# Patient Record
Sex: Male | Born: 1975 | Race: Black or African American | Hispanic: No | Marital: Married | Smoking: Never smoker
Health system: Southern US, Community
[De-identification: ages and names within clinical notes are randomized; demographics above are authoritative.]

## PROBLEM LIST (undated history)

## (undated) DIAGNOSIS — R079 Chest pain, unspecified: Secondary | ICD-10-CM

## (undated) DIAGNOSIS — G4730 Sleep apnea, unspecified: Secondary | ICD-10-CM

## (undated) DIAGNOSIS — R0602 Shortness of breath: Principal | ICD-10-CM

## (undated) HISTORY — DX: Shortness of breath: R06.02

## (undated) HISTORY — DX: Chest pain, unspecified: R07.9

## (undated) HISTORY — DX: Sleep apnea, unspecified: G47.30

---

## 2009-08-20 ENCOUNTER — Emergency Department (HOSPITAL_COMMUNITY): Admission: EM | Admit: 2009-08-20 | Discharge: 2009-08-20 | Payer: Self-pay | Admitting: Emergency Medicine

## 2012-12-11 ENCOUNTER — Ambulatory Visit: Payer: Self-pay | Admitting: Family Medicine

## 2012-12-11 LAB — DOT URINE DIP: Glucose,UR: NEGATIVE mg/dL (ref 0–75)

## 2018-08-02 NOTE — Progress Notes (Signed)
Cardiology Office Note:    Date:  08/03/2018   ID:  Jack Leitz., DOB [birth year], MRN [id number]  PCP:  Patient, No Pcp Per  Cardiologist:  Jack Herrlich, MD   Referring MD: No ref. provider found  ASSESSMENT:    1. Shortness of breath   2. Chest pain, unspecified type   3. Obstructive sleep apnea syndrome   4. Adult BMI 50.0-59.9 kg/sq m [facility])    PLAN:    In order of problems listed above:  1. He has shortness of breath associated with edema and temp symptoms typical of heart failure I will start him on a loop diuretic check chest x-ray routine labs proBNP level and echocardiogram ordered. 2. He has some symptoms of exertional chest pain EKG is no acute changes check troponin level weight echocardiogram and a require an ischemia evaluation 3. Poorly controlled he needs follow-up with sleep apnea practitioner 4. He has morbid obesity and this can be a cause of heart failure and also pulmonary hypoventilation syndromes.  Next appointment 1 week   Medication Adjustments/Labs and Tests Ordered: Current medicines are reviewed at length with the patient today.  Concerns regarding medicines are outlined above.  Orders Placed This Encounter  Procedures  . DG Chest 2 View  . Comprehensive Metabolic Panel (CMET)  . Pro b natriuretic peptide (BNP)  . Troponin I  . CBC  . EKG 12-Lead  . ECHOCARDIOGRAM COMPLETE   Meds ordered this encounter  Medications  . furosemide (LASIX) 40 MG tablet    Sig: Take 1 tablet (40 mg total) by mouth daily.    Dispense:  30 tablet    Refill:  3     Chief Complaint  Patient presents with  . Shortness of Breath    History of Present Illness:    Jack Lindner. is a 42 y.o. male who is being seen today for the evaluation of shortness of breath and chest pain self referred.  He has a history of obstructive sleep apnea but tells me his device is not working well with leak around the mask.  He has no known history of heart disease or  hypertension.  Recently has become aware of increasing shortness of breath that started when he tried to play with his son outdoors and developed severe shortness of breath that caused him to stop and rest for relief associated with some chest tightness.  Clearly shortness of breath was the predominant symptom he has intermittent episodes of chest tightness but is not always present when he finds himself severely short of breath the symptoms do not occur with minor activities walking from the car walking in his home and also is having episodes of being breathless at night and the other evening had to sit up on side of bed to recover.  There is been no palpitation or syncope associated with no cough or wheezing.  All this has made her very apprehensive and concerned and called our office yesterday.  His EKG in the office today is normal he has peripheral edema and I suspect he is in decompensated heart failure I will start him on a loop diuretic check labs including troponin proBNP I will do an echocardiogram at first opportunity early next week chest x-ray was ordered to be done today if possible.  Strongly encouraged him and his wife to call his pulmonologist as well as the device people to see if he can have his mask corrected  Past Medical History:  Diagnosis  Date  . Chest pain 08/03/2018  . Shortness of breath 08/03/2018    Past Surgical History:  Procedure Laterality Date  . NO PAST SURGERIES      Current Medications: No outpatient medications have been marked as taking for the 08/03/18 encounter (Office Visit) with Baldo Daub, MD.     Allergies:   Patient has no known allergies.   Social History   Socioeconomic History  . Marital status: Single    Spouse name: Not on file  . Number of children: Not on file  . Years of education: Not on file  . Highest education level: Not on file  Occupational History  . Not on file  Social Needs  . Financial resource strain: Not on file  .  Food insecurity:    Worry: Not on file    Inability: Not on file  . Transportation needs:    Medical: Not on file    Non-medical: Not on file  Tobacco Use  . Smoking status: Never Smoker  . Smokeless tobacco: Never Used  Substance and Sexual Activity  . Alcohol use: Yes    Comment: occ  . Drug use: Never  . Sexual activity: Not on file  Lifestyle  . Physical activity:    Days per week: Not on file    Minutes per session: Not on file  . Stress: Not on file  Relationships  . Social connections:    Talks on phone: Not on file    Gets together: Not on file    Attends religious service: Not on file    Active member of club or organization: Not on file    Attends meetings of clubs or organizations: Not on file    Relationship status: Not on file  Other Topics Concern  . Not on file  Social History Narrative  . Not on file     Family History: The patient's family history includes Diabetes in his maternal aunt; Heart disease in his mother; Hyperlipidemia in his father and mother; Hypertension in his mother.  ROS:   Review of Systems  Constitution: Positive for malaise/fatigue.  HENT: Negative.   Eyes: Negative.   Cardiovascular: Positive for chest pain, dyspnea on exertion, leg swelling, orthopnea and paroxysmal nocturnal dyspnea.  Respiratory: Positive for shortness of breath and snoring.   Endocrine: Negative.   Hematologic/Lymphatic: Negative.   Skin: Negative.   Musculoskeletal: Negative.   Gastrointestinal: Negative.   Genitourinary: Negative.   Neurological: Negative.   Psychiatric/Behavioral: Negative.   Allergic/Immunologic: Negative.    Please see the history of present illness.     All other systems reviewed and are negative.  EKGs/Labs/Other Studies Reviewed:    The following studies were reviewed today:   EKG:  EKG is  ordered today.  The ekg ordered today demonstrates sinus rhythm normal  Recent Labs: No results found for requested labs within last  8760 hours.  Recent Lipid Panel No results found for: CHOL, TRIG, HDL, CHOLHDL, VLDL, LDLCALC, LDLDIRECT  Physical Exam:    VS:  BP 114/88 (BP Location: Right Arm, Patient Position: Sitting, Cuff Size: Large)   Pulse 91   Ht 6\' 1"  (1.854 m)   Wt (!) 384 lb 1.9 oz (174.2 kg)   SpO2 96%   BMI 50.68 kg/m     Wt Readings from Last 3 Encounters:  08/03/18 (!) 384 lb 1.9 oz (174.2 kg)     GEN: Anxious morbidly obese well nourished, well developed in no acute distress HEENT: Normal  NECK: No JVD; No carotid bruits LYMPHATICS: No lymphadenopathy CARDIAC: Distant heart sounds RRR, no murmurs, rubs, gallops RESPIRATORY:  Clear to auscultation without rales, wheezing or rhonchi  ABDOMEN: Soft, non-tender, non-distended MUSCULOSKELETAL: 1-2+ pretibial edema; No deformity  SKIN: Warm and dry NEUROLOGIC:  Alert and oriented x 3 PSYCHIATRIC:  Normal affect     Signed, Jack HerrlichBrian Rayce Brahmbhatt, MD  08/03/2018 5:05 PM    [facility]

## 2018-08-03 ENCOUNTER — Encounter: Payer: Self-pay | Admitting: Cardiology

## 2018-08-03 ENCOUNTER — Ambulatory Visit (HOSPITAL_BASED_OUTPATIENT_CLINIC_OR_DEPARTMENT_OTHER)
Admission: RE | Admit: 2018-08-03 | Discharge: 2018-08-03 | Disposition: A | Discharge status: Home / Self Care | Payer: Medicaid Other | Source: Ambulatory Visit | Attending: Cardiology | Admitting: Cardiology

## 2018-08-03 ENCOUNTER — Ambulatory Visit (INDEPENDENT_AMBULATORY_CARE_PROVIDER_SITE_OTHER): Payer: Self-pay | Admitting: Cardiology

## 2018-08-03 VITALS — BP 114/88 | HR 91 | Ht 73.0 in | Wt 384.1 lb

## 2018-08-03 DIAGNOSIS — R0789 Other chest pain: Secondary | ICD-10-CM | POA: Insufficient documentation

## 2018-08-03 DIAGNOSIS — G4733 Obstructive sleep apnea (adult) (pediatric): Secondary | ICD-10-CM

## 2018-08-03 DIAGNOSIS — R0602 Shortness of breath: Secondary | ICD-10-CM | POA: Insufficient documentation

## 2018-08-03 DIAGNOSIS — R079 Chest pain, unspecified: Secondary | ICD-10-CM

## 2018-08-03 DIAGNOSIS — Z6841 Body Mass Index (BMI) 40.0 and over, adult: Secondary | ICD-10-CM

## 2018-08-03 DIAGNOSIS — G4730 Sleep apnea, unspecified: Secondary | ICD-10-CM | POA: Insufficient documentation

## 2018-08-03 HISTORY — DX: Chest pain, unspecified: R07.9

## 2018-08-03 HISTORY — DX: Shortness of breath: R06.02

## 2018-08-03 MED ORDER — FUROSEMIDE 40 MG PO TABS: 40.0000 mg | ORAL_TABLET | Freq: Every day | ORAL | 0 refills | Status: DC

## 2018-08-03 MED ORDER — FUROSEMIDE 40 MG PO TABS: 40.0000 mg | ORAL_TABLET | Freq: Every day | ORAL | 3 refills | Status: DC

## 2018-08-03 NOTE — Patient Instructions (Signed)
Medication Instructions:  Your physician has recommended you make the following change in your medication:   START furosemide (lasix) 40 mg: Take 1 tablet daily. START TODAY!  If you need a refill on your cardiac medications before your next appointment, please call your pharmacy.   Lab work: Your physician recommends that you return for lab work today: STAT CMP, ProBNP, Troponin I, CBC.   If you have labs (blood work) drawn today and your tests are completely normal, you will receive your results only by: Marland Kitchen MyChart Message (if you have MyChart) OR . A paper copy in the mail If you have any lab test that is abnormal or we need to change your treatment, we will call you to review the results.  Testing/Procedures: You had an EKG today.   A chest x-ray takes a picture of the organs and structures inside the chest, including the heart, lungs, and blood vessels. This test can show several things, including, whether the heart is enlarges; whether fluid is building up in the lungs; and whether pacemaker / defibrillator leads are still in place.  Your physician has requested that you have an echocardiogram. Echocardiography is a painless test that uses sound waves to create images of your heart. It provides your doctor with information about the size and shape of your heart and how well your heart's chambers and valves are working. This procedure takes approximately one hour. There are no restrictions for this procedure.  Follow-Up: At [facility], you and your health needs are our priority.  As part of our continuing mission to provide you with exceptional heart care, we have created designated Provider Care Teams.  These Care Teams include your primary Cardiologist (physician) and Advanced Practice Providers (APPs -  Physician Assistants and Nurse Practitioners) who all work together to provide you with the care you need, when you need it. You will need a follow up appointment in 1 weeks.      Furosemide tablets What is this medicine? FUROSEMIDE (fyoor [place] se mide) is a diuretic. It helps you make more urine and to lose salt and excess water from your body. This medicine is used to treat high blood pressure, and edema or swelling from heart, kidney, or liver disease. This medicine may be used for other purposes; ask your health care provider or pharmacist if you have questions. COMMON BRAND NAME(S): Active-Medicated Specimen Kit, Delone, Diuscreen, Lasix, RX Specimen Collection Kit, Specimen Collection Kit, URINX Medicated Specimen Collection What should I tell my health care provider before I take this medicine? They need to know if you have any of these conditions: -abnormal blood electrolytes -diarrhea or vomiting -gout -heart disease -kidney disease, small amounts of urine, or difficulty passing urine -liver disease -thyroid disease -an unusual or allergic reaction to furosemide, sulfa drugs, other medicines, foods, dyes, or preservatives -pregnant or trying to get pregnant -breast-feeding How should I use this medicine? Take this medicine by mouth with a glass of water. Follow the directions on the prescription label. You may take this medicine with or without food. If it upsets your stomach, take it with food or milk. Do not take your medicine more often than directed. Remember that you will need to pass more urine after taking this medicine. Do not take your medicine at a time of day that will cause you problems. Do not take at bedtime. Talk to your pediatrician regarding the use of this medicine in children. While this drug may be prescribed for selected conditions, precautions  do apply. Overdosage: If you think you have taken too much of this medicine contact a poison control center or emergency room at once. NOTE: This medicine is only for you. Do not share this medicine with others. What if I miss a dose? If you miss a dose, take it as soon as you can. If it is  almost time for your next dose, take only that dose. Do not take double or extra doses. What may interact with this medicine? -aspirin and aspirin-like medicines -certain antibiotics -chloral hydrate -cisplatin -cyclosporine -digoxin -diuretics -laxatives -lithium -medicines for blood pressure -medicines that relax muscles for surgery -methotrexate -NSAIDs, medicines for pain and inflammation like ibuprofen, naproxen, or indomethacin -phenytoin -steroid medicines like prednisone or cortisone -sucralfate -thyroid hormones This list may not describe all possible interactions. Give your health care provider a list of all the medicines, herbs, non-prescription drugs, or dietary supplements you use. Also tell them if you smoke, drink alcohol, or use illegal drugs. Some items may interact with your medicine. What should I watch for while using this medicine? Visit your doctor or health care professional for regular checks on your progress. Check your blood pressure regularly. Ask your doctor or health care professional what your blood pressure should be, and when you should contact him or her. If you are a diabetic, check your blood sugar as directed. You may need to be on a special diet while taking this medicine. Check with your doctor. Also, ask how many glasses of fluid you need to drink a day. You must not get dehydrated. You may get drowsy or dizzy. Do not drive, use machinery, or do anything that needs mental alertness until you know how this drug affects you. Do not stand or sit up quickly, especially if you are an older patient. This reduces the risk of dizzy or fainting spells. Alcohol can make you more drowsy and dizzy. Avoid alcoholic drinks. This medicine can make you more sensitive to the sun. Keep out of the sun. If you cannot avoid being in the sun, wear protective clothing and use sunscreen. Do not use sun lamps or tanning beds/booths. What side effects may I notice from receiving  this medicine? Side effects that you should report to your doctor or health care professional as soon as possible: -blood in urine or stools -dry mouth -fever or chills -hearing loss or ringing in the ears -irregular heartbeat -muscle pain or weakness, cramps -skin rash -stomach upset, pain, or nausea -tingling or numbness in the hands or feet -unusually weak or tired -vomiting or diarrhea -yellowing of the eyes or skin Side effects that usually do not require medical attention (report to your doctor or health care professional if they continue or are bothersome): -headache -loss of appetite -unusual bleeding or bruising This list may not describe all possible side effects. Call your doctor for medical advice about side effects. You may report side effects to FDA at 1-800-FDA-1088. Where should I keep my medicine? Keep out of the reach of children. Store at room temperature between 15 and 30 degrees C (59 and 86 degrees F). Protect from light. Throw away any unused medicine after the expiration date. NOTE: This sheet is a summary. It may not cover all possible information. If you have questions about this medicine, talk to your doctor, pharmacist, or health care provider.  2018 Elsevier/Gold Standard (2014-11-19 13:49:50)    Echocardiogram An echocardiogram, or echocardiography, uses sound waves (ultrasound) to produce an image of your heart. The echocardiogram  is simple, painless, obtained within a short period of time, and offers valuable information to your health care provider. The images from an echocardiogram can provide information such as:  Evidence of coronary artery disease (CAD).  Heart size.  Heart muscle function.  Heart valve function.  Aneurysm detection.  Evidence of a past heart attack.  Fluid buildup around the heart.  Heart muscle thickening.  Assess heart valve function.  Tell a health care provider about:  Any allergies you have.  All  medicines you are taking, including vitamins, herbs, eye drops, creams, and over-the-counter medicines.  Any problems you or family members have had with anesthetic medicines.  Any blood disorders you have.  Any surgeries you have had.  Any medical conditions you have.  Whether you are pregnant or may be pregnant. What happens before the procedure? No special preparation is needed. Eat and drink normally. What happens during the procedure?  In order to produce an image of your heart, gel will be applied to your chest and a wand-like tool (transducer) will be moved over your chest. The gel will help transmit the sound waves from the transducer. The sound waves will harmlessly bounce off your heart to allow the heart images to be captured in real-time motion. These images will then be recorded.  You may need an IV to receive a medicine that improves the quality of the pictures. What happens after the procedure? You may return to your normal schedule including diet, activities, and medicines, unless your health care provider tells you otherwise. This information is not intended to replace advice given to you by your health care provider. Make sure you discuss any questions you have with your health care provider. Document Released: 08/26/2000 Document Revised: 04/16/2016 Document Reviewed: 05/06/2013 Elsevier Interactive Patient Education  2017 [employer].

## 2018-08-04 LAB — COMPREHENSIVE METABOLIC PANEL
ALK PHOS: 67 IU/L (ref 39–117)
ALT: 12 IU/L (ref 0–44)
AST: 12 IU/L (ref 0–40)
Albumin/Globulin Ratio: 1.1 — ABNORMAL LOW (ref 1.2–2.2)
Albumin: 4.3 g/dL (ref 3.5–5.5)
BUN/Creatinine Ratio: 12 (ref 9–20)
BUN: 14 mg/dL (ref 6–24)
Bilirubin Total: <0.2 mg/dL (ref 0.0–1.2)
CO2: 21 mmol/L (ref 20–29)
CREATININE: 1.2 mg/dL (ref 0.76–1.27)
Calcium: 9.5 mg/dL (ref 8.7–10.2)
Chloride: 102 mmol/L (ref 96–106)
GFR calc Af Amer: 87 mL/min/{1.73_m2} (ref >59)
GFR calc non Af Amer: 75 mL/min/{1.73_m2} (ref >59)
GLUCOSE: 88 mg/dL (ref 65–99)
Globulin, Total: 4 g/dL (ref 1.5–4.5)
Potassium: 3.9 mmol/L (ref 3.5–5.2)
Sodium: 140 mmol/L (ref 134–144)
Total Protein: 8.3 g/dL (ref 6.0–8.5)

## 2018-08-04 LAB — PRO B NATRIURETIC PEPTIDE: NT-Pro BNP: 77 pg/mL (ref 0–86)

## 2018-08-04 LAB — CBC
HEMOGLOBIN: 13.5 g/dL (ref 13.0–17.7)
Hematocrit: 40.1 % (ref 37.5–51.0)
MCH: 26.5 pg — ABNORMAL LOW (ref 26.6–33.0)
MCHC: 33.7 g/dL (ref 31.5–35.7)
MCV: 79 fL (ref 79–97)
Platelets: 402 10*3/uL (ref 150–450)
RBC: 5.09 x10E6/uL (ref 4.14–5.80)
RDW: 13.6 % (ref 12.3–15.4)
WBC: 8.3 10*3/uL (ref 3.4–10.8)

## 2018-08-04 LAB — TROPONIN I

## 2018-08-06 ENCOUNTER — Telehealth: Payer: Self-pay | Admitting: *Deleted

## 2018-08-06 NOTE — Telephone Encounter (Signed)
[facility] with results for 2 View Chest X-Ray and CT of Chest. Both were normal Hrt size within normal limits, no active cardial disease, both lungs clear. CT of Chest no active pulmonary or cardial disease.

## 2018-08-06 NOTE — Telephone Encounter (Signed)
Report for Dr. Dulce SellarMunley

## 2018-08-06 NOTE — Telephone Encounter (Signed)
Dr. Dulce SellarMunley, here is the CXR and CT report for pt.

## 2018-08-07 ENCOUNTER — Ambulatory Visit (HOSPITAL_COMMUNITY): Payer: Medicaid Other | Attending: Cardiovascular Disease

## 2018-08-07 ENCOUNTER — Encounter: Payer: Self-pay | Admitting: Cardiology

## 2018-08-07 DIAGNOSIS — R079 Chest pain, unspecified: Secondary | ICD-10-CM | POA: Diagnosis not present

## 2018-08-07 DIAGNOSIS — R0602 Shortness of breath: Secondary | ICD-10-CM | POA: Insufficient documentation

## 2018-08-08 ENCOUNTER — Encounter: Payer: Self-pay | Admitting: Cardiology

## 2018-08-08 ENCOUNTER — Ambulatory Visit (INDEPENDENT_AMBULATORY_CARE_PROVIDER_SITE_OTHER): Payer: Self-pay | Admitting: Cardiology

## 2018-08-08 VITALS — BP 112/78 | HR 98 | Ht 73.0 in | Wt 385.2 lb

## 2018-08-08 DIAGNOSIS — G4733 Obstructive sleep apnea (adult) (pediatric): Secondary | ICD-10-CM

## 2018-08-08 DIAGNOSIS — R079 Chest pain, unspecified: Secondary | ICD-10-CM

## 2018-08-08 DIAGNOSIS — R0602 Shortness of breath: Secondary | ICD-10-CM

## 2018-08-08 MED ORDER — POTASSIUM CHLORIDE CRYS ER 20 MEQ PO TBCR: 20.0000 meq | EXTENDED_RELEASE_TABLET | Freq: Every day | ORAL | 3 refills | Status: DC

## 2018-08-08 NOTE — H&P (View-Only) (Signed)
Cardiology Office Note:    Date:  08/08/2018   ID:  Jack Leitz., DOB [birth year], MRN [id number]  PCP:  Patient, No Pcp Per  Cardiologist:  Norman Herrlich, MD    Referring MD: No ref. provider found    ASSESSMENT:    1. Chest pain, unspecified type   2. Shortness of breath   3. Obstructive sleep apnea syndrome    PLAN:    In order of problems listed above:  1. Atypical symptoms however Jack Buckley is a poor candidate for noninvasive evaluation with morbid obesity and severe shortness of breath limiting his ability to perform stress modalities.  I think that this situation is best served by coronary angiography but my expectation is Jack Buckley does not have severe obstructive CAD.  I told the patient his wife is very unusual to have chest pain with sleep apnea. 2. His symptoms are quite disproportionate to echocardiogram arterial blood gas proBNP level and echocardiogram.  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 3. See above poorly treated   Next appointment: 2 to 3 weeks   Medication Adjustments/Labs and Tests Ordered: Current medicines are reviewed at length with the patient today.  Concerns regarding medicines are outlined above.  No orders of the defined types were placed in this encounter.  Meds ordered this encounter  Medications  . potassium chloride SA (KLOR-CON M20) 20 MEQ tablet    Sig: Take 1 tablet (20 mEq total) by mouth daily.    Dispense:  30 tablet    Refill:  3    No chief complaint on file.   History of Present Illness:    Jack Fier. is a 42 y.o. male with a hx of shortness of breath and chest pain last seen 08/03/2018 appeared to be in decompensated heart failure was started as a loop diuretic proBNP level subsequently was low and consistent with heart  failure and echocardiogram does not show findings of systolic or diastolic heart failure or pulmonary artery hypertension. Marland Kitchen Jack Buckley was seen in the emergency room [facility] 08/05/2018 for the same complaints troponin and proBNP were normal CT of the chest showed no evidence of pulmonary embolism or lung disease.  Echo 08/07/18: Study Conclusions - Left ventricle: The cavity size was normal. There was mild concentric hypertrophy. Systolic function was normal. The estimated ejection fraction was in the range of 60% to 65%. Wall motion was normal; there were no regional wall motion abnormalities. Left ventricular diastolic function parameters were normal. - Aortic valve: Transvalvular velocity was within the normal range. There was no stenosis. There was no regurgitation. - Mitral valve: Transvalvular velocity was within the normal range. There was no evidence for stenosis. There was no regurgitation. - Right ventricle: The cavity size was normal. Wall thickness was normal. Systolic function was normal. - Atrial septum: No defect or patent foramen ovale was identified by color flow Doppler. - Tricuspid valve: There was trivial regurgitation. - Pulmonary arteries: Systolic pressure was within the normal range. PA peak pressure: 20 mm Hg (S).  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  I strongly encouraged him to get a new mask and  his wife said she will call his company today to be 100% compliant with CPAP but in view of his ongoing unrelenting symptoms and complaints of chest pain at rest I have asked him to undergo diagnostic coronary angiography benefits risk and options were detailed Jack Buckley has no dye allergy or contraindication to coronary angiography.  I think his body habitus with a BMI of over 50 and his severe shortness of breath limits the utility of noninvasive stress modalities.  Patient and his wife voiced agreement.  In the interim I asked him to stop his loop diuretic and I started on potassium supplement 20 mEq daily with serum potassium of 3.4 at his ED visit.  Jack Buckley is having no cough wheezing no GI discomfort his symptoms are really at rest although they also occur with physical effort where Jack Buckley develops severe shortness of breath the force him to stop and pause.  Arterial blood gas was performed this week and was normal with no CO2 retention and PO2 of 70 on room air Past Medical History:  Diagnosis Date  . Chest pain 08/03/2018  . Shortness of breath 08/03/2018    Past Surgical History:  Procedure Laterality Date  . NO PAST SURGERIES      Current Medications: Current Meds  Medication Sig  . furosemide (LASIX) 40 MG tablet Take 1 tablet (40 mg total) by mouth daily.  . Magnesium 400 MG CAPS Take 1 capsule by mouth daily.     Allergies:   Patient has no known allergies.   Social History   Socioeconomic History  . Marital status: Single    Spouse name: Not on file  . Number of children: Not on file  . Years of education: Not on file  . Highest education level: Not on file  Occupational History  . Not on file  Social Needs  . Financial resource strain: Not on file  . Food insecurity:    Worry: Not on file    Inability: Not on file  . Transportation needs:    Medical: Not on file    Non-medical: Not on file  Tobacco Use  . Smoking status: Never Smoker  . Smokeless tobacco: Never Used    Substance and Sexual Activity  . Alcohol use: Yes    Comment: occ  . Drug use: Never  . Sexual activity: Not on file  Lifestyle  . Physical activity:    Days per week: Not on file    Minutes per session: Not on file  . Stress: Not on file  Relationships  . Social connections:    Talks on phone: Not on file    Gets together: Not on file    Attends religious service: Not on file    Active member of club or organization: Not on file    Attends meetings of clubs or organizations: Not on file    Relationship status: Not on file  Other Topics Concern  . Not on file  Social History Narrative  . Not on file     Family History: The patient's family history includes  Diabetes in his maternal aunt; Heart disease in his mother; Hyperlipidemia in his father and mother; Hypertension in his mother. ROS:   Please see the history of present illness.    All other systems reviewed and are negative.  EKGs/Labs/Other Studies Reviewed:    The following studies were reviewed today:   Recent Labs:  08/05/18 K 3.4 Cr 1.09 Troponin normal on 2 assays  BNP 10! 08/03/2018: ALT 12; BUN 14; Creatinine, Ser 1.20; Hemoglobin 13.5; NT-Pro BNP 77; Platelets 402; Potassium 3.9; Sodium 140  Recent Lipid Panel No results found for: CHOL, TRIG, HDL, CHOLHDL, VLDL, LDLCALC, LDLDIRECT  Physical Exam:    VS:  BP 112/78 (BP Location: Right Arm, Patient Position: Sitting, Cuff Size: Large)   Pulse 98   Ht 6\' 1"  (1.854 m)   Wt (!) 385 lb 3.2 oz (174.7 kg)   SpO2 97%   BMI 50.82 kg/m     Wt Readings from Last 3 Encounters:  08/08/18 (!) 385 lb 3.2 oz (174.7 kg)  08/03/18 (!) 384 lb 1.9 oz (174.2 kg)     GEN: Jack Buckley has marked obesity BMI exceeds 50 is having shortness of breath and chest discomfort tightness at rest.  Well nourished, well developed in no acute distress HEENT: Normal NECK: No JVD; No carotid bruits LYMPHATICS: No lymphadenopathy CARDIAC: Distant heart sounds RRR, no murmurs, rubs,  gallops RESPIRATORY:  Clear to auscultation without rales, wheezing or rhonchi  ABDOMEN: Soft, non-tender, non-distended MUSCULOSKELETAL:  No edema; No deformity  SKIN: Warm and dry NEUROLOGIC:  Alert and oriented x 3 PSYCHIATRIC:  Normal affect    Signed, Norman HerrlichBrian Huyen Perazzo, MD  08/08/2018 4:26 PM    [facility]

## 2018-08-08 NOTE — Patient Instructions (Addendum)
Medication Instructions:  Your physician has recommended you make the following change in your medication:   STOP furosemide (lasix)   START potassium chloride 20 mEq: Take 1 tablet daily   If you need a refill on your cardiac medications before your next appointment, please call your pharmacy.   Lab work:  None  If you have labs (blood work) drawn today and your tests are completely normal, you will receive your results only by: Marland Kitchen. MyChart Message (if you have MyChart) OR . A paper copy in the mail If you have any lab test that is abnormal or we need to change your treatment, we will call you to review the results.  Testing/Procedures:    [facility] [place] [address] [place] [id number] Dept: [phone] Loc: [phone]  Bonney LeitzMichael L Szczygiel Jr.  08/08/2018  You are scheduled for a Cardiac Catheterization on Monday, December 2 with Dr. Cristal Deerhristopher End.  1. Please arrive at the [facility] at [facility]: [address] [place], [place] [address] at 7:00 AM (This time is two hours before your procedure to ensure your preparation). Free valet parking service is available.   Special note: Every effort is made to have your procedure done on time. Please understand that emergencies sometimes delay scheduled procedures.  2. Diet: Do not eat solid foods after midnight.  The patient may have clear liquids until 5am upon the day of the procedure.  3. Labs: None needed.  4. Medication instructions in preparation for your procedure:   Contrast Allergy: No   On the morning of your procedure, take your Aspirin and any morning medicines NOT listed above.  You may use sips of water.  5. Plan for one night stay--bring personal belongings. 6. Bring a current list of your medications and current insurance cards. 7. You MUST have a responsible person to drive you home. 8. Someone MUST  be with you the first 24 hours after you arrive home or your discharge will be delayed. 9. Please wear clothes that are easy to get on and off and wear slip-on shoes.  Thank you for allowing us to care for you!   -- [facility]   Follow-Up: At [facility], you and your health needs are our priority.  As part of our continuing mission to provide you with exceptional heart care, we have created designated Provider Care Teams.  These Care Teams include your primary Cardiologist (physician) and Advanced Practice Providers (APPs -  Physician Assistants and Nurse Practitioners) who all work together to provide you with the care you need, when you need it. You will need a follow up appointment in 3 weeks.

## 2018-08-08 NOTE — Progress Notes (Signed)
Cardiology Office Note:    Date:  08/08/2018   ID:  Jack L Sprague Jr., DOB 12/15/1975, MRN [id number]  PCP:  Patient, No Pcp Per  Cardiologist:   , MD    Referring MD: No ref. provider found    ASSESSMENT:    1. Chest pain, unspecified type   2. Shortness of breath   3. Obstructive sleep apnea syndrome    PLAN:    In order of problems listed above:  1. Atypical symptoms however he is a poor candidate for noninvasive evaluation with morbid obesity and severe shortness of breath limiting his ability to perform stress modalities.  I think that this situation is best served by coronary angiography but my expectation is he does not have severe obstructive CAD.  I told the patient his wife is very unusual to have chest pain with sleep apnea. 2. His symptoms are quite disproportionate to echocardiogram arterial blood gas proBNP level and echocardiogram.  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 3. See above poorly treated   Next appointment: 2 to 3 weeks   Medication Adjustments/Labs and Tests Ordered: Current medicines are reviewed at length with the patient today.  Concerns regarding medicines are outlined above.  No orders of the defined types were placed in this encounter.  Meds ordered this encounter  Medications  . potassium chloride SA (KLOR-CON M20) 20 MEQ tablet    Sig: Take 1 tablet (20 mEq total) by mouth daily.    Dispense:  30 tablet    Refill:  3    No chief complaint on file.   History of Present Illness:    Jack L Canterbury Jr. is a 42 y.o. male with a hx of shortness of breath and chest pain last seen 08/03/2018 appeared to be in decompensated heart failure was started as a loop diuretic proBNP level subsequently was low and consistent with heart  failure and echocardiogram does not show findings of systolic or diastolic heart failure or pulmonary artery hypertension. . He was seen in the emergency room [facility] 08/05/2018 for the same complaints troponin and proBNP were normal CT of the chest showed no evidence of pulmonary embolism or lung disease.  Echo 08/07/18: Study Conclusions - Left ventricle: The cavity size was normal. There was mild concentric hypertrophy. Systolic function was normal. The estimated ejection fraction was in the range of 60% to 65%. Wall motion was normal; there were no regional wall motion abnormalities. Left ventricular diastolic function parameters were normal. - Aortic valve: Transvalvular velocity was within the normal range. There was no stenosis. There was no regurgitation. - Mitral valve: Transvalvular velocity was within the normal range. There was no evidence for stenosis. There was no regurgitation. - Right ventricle: The cavity size was normal. Wall thickness was normal. Systolic function was normal. - Atrial septum: No defect or patent foramen ovale was identified by color flow Doppler. - Tricuspid valve: There was trivial regurgitation. - Pulmonary arteries: Systolic pressure was within the normal range. PA peak pressure: 20 mm Hg (S).  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  I strongly encouraged him to get a new mask and  his wife said she will call his company today to be 100% compliant with CPAP but in view of his ongoing unrelenting symptoms and complaints of chest pain at rest I have asked him to undergo diagnostic coronary angiography benefits risk and options were detailed he has no dye allergy or contraindication to coronary angiography.  I think his body habitus with a BMI of over 50 and his severe shortness of breath limits the utility of noninvasive stress modalities.  Patient and his wife voiced agreement.  In the interim I asked him to stop his loop diuretic and I started on potassium supplement 20 mEq daily with serum potassium of 3.4 at his ED visit.  He is having no cough wheezing no GI discomfort his symptoms are really at rest although they also occur with physical effort where he develops severe shortness of breath the force him to stop and pause.  Arterial blood gas was performed this week and was normal with no CO2 retention and PO2 of 70 on room air Past Medical History:  Diagnosis Date  . Chest pain 08/03/2018  . Shortness of breath 08/03/2018    Past Surgical History:  Procedure Laterality Date  . NO PAST SURGERIES      Current Medications: Current Meds  Medication Sig  . furosemide (LASIX) 40 MG tablet Take 1 tablet (40 mg total) by mouth daily.  . Magnesium 400 MG CAPS Take 1 capsule by mouth daily.     Allergies:   Patient has no known allergies.   Social History   Socioeconomic History  . Marital status: Single    Spouse name: Not on file  . Number of children: Not on file  . Years of education: Not on file  . Highest education level: Not on file  Occupational History  . Not on file  Social Needs  . Financial resource strain: Not on file  . Food insecurity:    Worry: Not on file    Inability: Not on file  . Transportation needs:    Medical: Not on file    Non-medical: Not on file  Tobacco Use  . Smoking status: Never Smoker  . Smokeless tobacco: Never Used    Substance and Sexual Activity  . Alcohol use: Yes    Comment: occ  . Drug use: Never  . Sexual activity: Not on file  Lifestyle  . Physical activity:    Days per week: Not on file    Minutes per session: Not on file  . Stress: Not on file  Relationships  . Social connections:    Talks on phone: Not on file    Gets together: Not on file    Attends religious service: Not on file    Active member of club or organization: Not on file    Attends meetings of clubs or organizations: Not on file    Relationship status: Not on file  Other Topics Concern  . Not on file  Social History Narrative  . Not on file     Family History: The patient's family history includes   Diabetes in his maternal aunt; Heart disease in his mother; Hyperlipidemia in his father and mother; Hypertension in his mother. ROS:   Please see the history of present illness.    All other systems reviewed and are negative.  EKGs/Labs/Other Studies Reviewed:    The following studies were reviewed today:   Recent Labs:  08/05/18 K 3.4 Cr 1.09 Troponin normal on 2 assays  BNP 10! 08/03/2018: ALT 12; BUN 14; Creatinine, Ser 1.20; Hemoglobin 13.5; NT-Pro BNP 77; Platelets 402; Potassium 3.9; Sodium 140  Recent Lipid Panel No results found for: CHOL, TRIG, HDL, CHOLHDL, VLDL, LDLCALC, LDLDIRECT  Physical Exam:    VS:  BP 112/78 (BP Location: Right Arm, Patient Position: Sitting, Cuff Size: Large)   Pulse 98   Ht 6' 1" (1.854 m)   Wt (!) 385 lb 3.2 oz (174.7 kg)   SpO2 97%   BMI 50.82 kg/m     Wt Readings from Last 3 Encounters:  08/08/18 (!) 385 lb 3.2 oz (174.7 kg)  08/03/18 (!) 384 lb 1.9 oz (174.2 kg)     GEN: He has marked obesity BMI exceeds 50 is having shortness of breath and chest discomfort tightness at rest.  Well nourished, well developed in no acute distress HEENT: Normal NECK: No JVD; No carotid bruits LYMPHATICS: No lymphadenopathy CARDIAC: Distant heart sounds RRR, no murmurs, rubs,  gallops RESPIRATORY:  Clear to auscultation without rales, wheezing or rhonchi  ABDOMEN: Soft, non-tender, non-distended MUSCULOSKELETAL:  No edema; No deformity  SKIN: Warm and dry NEUROLOGIC:  Alert and oriented x 3 PSYCHIATRIC:  Normal affect    Signed,  , MD  08/08/2018 4:26 PM     [facility]  

## 2018-08-08 NOTE — Addendum Note (Signed)
Addended by: Norman HerrlichMUNLEY, Cederic Mozley on: 08/08/2018 04:37 PM   Modules accepted: Orders, SmartSet

## 2018-08-13 ENCOUNTER — Encounter (HOSPITAL_COMMUNITY)
Admission: RE | Disposition: A | Discharge status: Home / Self Care | Payer: Self-pay | Source: Ambulatory Visit | Attending: Internal Medicine

## 2018-08-13 ENCOUNTER — Ambulatory Visit (HOSPITAL_COMMUNITY)
Admission: RE | Admit: 2018-08-13 | Discharge: 2018-08-13 | Disposition: A | Discharge status: Home / Self Care | Payer: Medicaid Other | Source: Ambulatory Visit | Attending: Internal Medicine | Admitting: Internal Medicine

## 2018-08-13 ENCOUNTER — Other Ambulatory Visit: Payer: Self-pay

## 2018-08-13 DIAGNOSIS — G4733 Obstructive sleep apnea (adult) (pediatric): Secondary | ICD-10-CM | POA: Diagnosis not present

## 2018-08-13 DIAGNOSIS — I509 Heart failure, unspecified: Secondary | ICD-10-CM | POA: Insufficient documentation

## 2018-08-13 DIAGNOSIS — R079 Chest pain, unspecified: Secondary | ICD-10-CM

## 2018-08-13 DIAGNOSIS — R0602 Shortness of breath: Secondary | ICD-10-CM

## 2018-08-13 DIAGNOSIS — R0789 Other chest pain: Secondary | ICD-10-CM | POA: Diagnosis not present

## 2018-08-13 DIAGNOSIS — Z8249 Family history of ischemic heart disease and other diseases of the circulatory system: Secondary | ICD-10-CM | POA: Insufficient documentation

## 2018-08-13 DIAGNOSIS — Z79899 Other long term (current) drug therapy: Secondary | ICD-10-CM | POA: Insufficient documentation

## 2018-08-13 HISTORY — PX: LEFT HEART CATH AND CORONARY ANGIOGRAPHY: CATH118249

## 2018-08-13 SURGERY — LEFT HEART CATH AND CORONARY ANGIOGRAPHY
Anesthesia: LOCAL

## 2018-08-13 MED ORDER — SODIUM CHLORIDE 0.9 % IV SOLN: 250.0000 mL | INTRAVENOUS | Status: DC | PRN

## 2018-08-13 MED ORDER — LIDOCAINE HCL (PF) 1 % IJ SOLN
INTRAMUSCULAR | Status: AC
  Filled 2018-08-13: qty 30

## 2018-08-13 MED ORDER — HEPARIN (PORCINE) IN NACL 1000-0.9 UT/500ML-% IV SOLN
INTRAVENOUS | Status: DC | PRN
  Administered 2018-08-13 (×2): 500 mL

## 2018-08-13 MED ORDER — VERAPAMIL HCL 2.5 MG/ML IV SOLN
INTRAVENOUS | Status: DC | PRN
  Administered 2018-08-13: 10 mL via INTRA_ARTERIAL

## 2018-08-13 MED ORDER — FENTANYL CITRATE (PF) 100 MCG/2ML IJ SOLN
INTRAMUSCULAR | Status: AC
  Filled 2018-08-13: qty 2

## 2018-08-13 MED ORDER — VERAPAMIL HCL 2.5 MG/ML IV SOLN
INTRAVENOUS | Status: AC
  Filled 2018-08-13: qty 2

## 2018-08-13 MED ORDER — ONDANSETRON HCL 4 MG/2ML IJ SOLN: 4.0000 mg | Freq: Four times a day (QID) | INTRAMUSCULAR | Status: DC | PRN

## 2018-08-13 MED ORDER — SODIUM CHLORIDE 0.9% FLUSH: 3.0000 mL | Freq: Two times a day (BID) | INTRAVENOUS | Status: DC

## 2018-08-13 MED ORDER — IOHEXOL 350 MG/ML SOLN
INTRAVENOUS | Status: DC | PRN
  Administered 2018-08-13: 50 mL via INTRAVENOUS

## 2018-08-13 MED ORDER — SODIUM CHLORIDE 0.9 % IV SOLN: INTRAVENOUS | Status: DC

## 2018-08-13 MED ORDER — ASPIRIN 81 MG PO CHEW: 81.0000 mg | CHEWABLE_TABLET | ORAL | Status: DC

## 2018-08-13 MED ORDER — HEPARIN (PORCINE) IN NACL 1000-0.9 UT/500ML-% IV SOLN
INTRAVENOUS | Status: AC
  Filled 2018-08-13: qty 1000

## 2018-08-13 MED ORDER — HEPARIN SODIUM (PORCINE) 1000 UNIT/ML IJ SOLN
INTRAMUSCULAR | Status: DC | PRN
  Administered 2018-08-13: 5000 [IU] via INTRAVENOUS

## 2018-08-13 MED ORDER — LIDOCAINE HCL (PF) 1 % IJ SOLN
INTRAMUSCULAR | Status: DC | PRN
  Administered 2018-08-13: 2 mL via INTRADERMAL

## 2018-08-13 MED ORDER — MIDAZOLAM HCL 2 MG/2ML IJ SOLN
INTRAMUSCULAR | Status: DC | PRN
  Administered 2018-08-13: 1 mg via INTRAVENOUS

## 2018-08-13 MED ORDER — MIDAZOLAM HCL 2 MG/2ML IJ SOLN
INTRAMUSCULAR | Status: AC
  Filled 2018-08-13: qty 2

## 2018-08-13 MED ORDER — FENTANYL CITRATE (PF) 100 MCG/2ML IJ SOLN
INTRAMUSCULAR | Status: DC | PRN
  Administered 2018-08-13: 25 ug via INTRAVENOUS

## 2018-08-13 MED ORDER — SODIUM CHLORIDE 0.9% FLUSH: 3.0000 mL | INTRAVENOUS | Status: DC | PRN

## 2018-08-13 MED ORDER — SODIUM CHLORIDE 0.9 % IV SOLN
INTRAVENOUS | Status: DC
  Administered 2018-08-13: 09:00:00 via INTRAVENOUS

## 2018-08-13 MED ORDER — ACETAMINOPHEN 325 MG PO TABS: 650.0000 mg | ORAL_TABLET | ORAL | Status: DC | PRN

## 2018-08-13 SURGICAL SUPPLY — 11 items
CATH 5FR JL3.5 JR4 ANG PIG MP (CATHETERS) ×2 IMPLANT
DEVICE RAD COMP TR BAND LRG (VASCULAR PRODUCTS) ×2 IMPLANT
GLIDESHEATH SLEND SS 6F .021 (SHEATH) ×2 IMPLANT
GUIDEWIRE INQWIRE 1.5J.035X260 (WIRE) ×1 IMPLANT
INQWIRE 1.5J .035X260CM (WIRE) ×2
KIT HEART LEFT (KITS) ×2 IMPLANT
PACK CARDIAC CATHETERIZATION (CUSTOM PROCEDURE TRAY) ×2 IMPLANT
PAD PRO RADIOLUCENT 2001M-C (PAD) ×2 IMPLANT
SYR MEDRAD MARK 7 150ML (SYRINGE) ×2 IMPLANT
TRANSDUCER W/STOPCOCK (MISCELLANEOUS) ×2 IMPLANT
TUBING CIL FLEX 10 FLL-RA (TUBING) ×2 IMPLANT

## 2018-08-13 NOTE — Brief Op Note (Signed)
BRIEF CARDIAC CATHETERIZATION NOTE  DATE: 08/13/2018 TIME: 11:43 AM  PATIENT:  Jack LeitzMichael L Bartley Jr.  42 y.o. male  PRE-OPERATIVE DIAGNOSIS:  Chest pain and shortness of breath  POST-OPERATIVE DIAGNOSIS:  Same  PROCEDURE:  Procedure(s): LEFT HEART CATH AND CORONARY ANGIOGRAPHY (N/A)  SURGEON:  Surgeon(s) and Role:    Yvonne Kendall* Dhanya Bogle, MD - Primary  FINDINGS: 1. No angiographically significant coronary artery disease. 2. Normal left ventricular filling pressure.  RECOMMENDATIONS: 1. Medical therapy and primary prevention. 2. Follow-up with Dr. 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  Yvonne Kendallhristopher Alzina Golda, MD [facility] Pager: [phone]

## 2018-08-13 NOTE — Discharge Instructions (Signed)

## 2018-08-13 NOTE — Interval H&P Note (Signed)
History and Physical Interval Note:  08/13/2018 8:52 AM  Jack LeitzMichael L Lawrie Jr.  has presented today for surgery, with the diagnosis of chest pain and shortness of breath. The various methods of treatment have been discussed with the patient and family. After consideration of risks, benefits and other options for treatment, the patient has consented to  Procedure(s): LEFT HEART CATH AND CORONARY ANGIOGRAPHY (N/A) as a surgical intervention .  The patient's history has been reviewed, patient examined, no change in status, stable for surgery.  I have reviewed the patient's chart and labs.  Questions were answered to the patient's satisfaction.    Cath Lab Visit (complete for each Cath Lab visit)  Clinical Evaluation Leading to the Procedure:   ACS: No.  Non-ACS:    Anginal Classification: CCS III  Anti-ischemic medical therapy: No Therapy  Non-Invasive Test Results: No non-invasive testing performed  Prior CABG: No previous CABG  Edie Vallandingham

## 2018-08-14 ENCOUNTER — Encounter (HOSPITAL_COMMUNITY): Payer: Self-pay | Admitting: Internal Medicine

## 2018-08-14 ENCOUNTER — Telehealth: Payer: Self-pay | Admitting: *Deleted

## 2018-08-14 DIAGNOSIS — R0602 Shortness of breath: Secondary | ICD-10-CM

## 2018-08-14 DIAGNOSIS — G4733 Obstructive sleep apnea (adult) (pediatric): Secondary | ICD-10-CM

## 2018-08-14 NOTE — Telephone Encounter (Signed)
Jack Buckley is scheduled to follow up with Dr. Dulce SellarMunley after cardiac catheterization on Thursday, 08/16/18 to discuss returning to work.   Jack Buckley is agreeable to pulmonology referral due to shortness of breath and improper fitting of CPAP mask. Urgent referral per Dr. Dulce SellarMunley has been placed. Advised Jack Buckley he would be contacted to schedule this appointment.   Jack Buckley verbalized understanding. No further questions.

## 2018-08-15 NOTE — Progress Notes (Signed)
Cardiology Office Note:    Date:  08/16/2018   ID:  Jack Buckley., DOB April 21, 1976, MRN [id number]  PCP:  Patient, No Pcp Per  Cardiologist:  Norman Herrlich, MD    Referring MD: No ref. provider found    ASSESSMENT:    1. Obstructive sleep apnea syndrome   2. Adult BMI 50.0-59.9 kg/sq m [facility])    PLAN:    In order of problems listed above:  1. Urged him I strongly encouraged him to get back to sleep apnea physician as I feel his symptom complex is due to poorly treated sleep apnea with a large air leak and he needs reevaluation.  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 Morbid obesity plays a role strongly encouraged weight loss and if ineffective bariatric surgery   Next appointment: As needed   Medication Adjustments/Labs and Tests Ordered: Current medicines are reviewed at length with the patient today.  Concerns regarding medicines are outlined above.  No orders of the defined types were placed in this encounter.  No orders of the defined types were placed in this encounter.   No chief complaint on file.   History of Present Illness:    Jack Buckley. is a 42 y.o. male with a hx of shortness of breath and chest pain initially seen 08/03/2018 appeared to be in decompensated heart failure was started as a loop diuretic proBNP level subsequently was low and consistent with heart failure and echocardiogram does not show findings of systolic or diastolic heart failure or pulmonary artery hypertension. Marland Kitchen He was seen in the emergency room [facility] 08/05/2018 for the same complaints troponin and proBNP were normal CT of the chest showed no evidence of pulmonary embolism or lung disease.   Echo 08/07/18: Study Conclusions - Left ventricle: The cavity size was normal. There was mild   concentric hypertrophy. Systolic function was normal. The   estimated  ejection fraction was in the range of 60% to 65%. Wall   motion was normal; there were no regional wall motion   abnormalities. Left ventricular diastolic function parameters   were normal. - Aortic valve: Transvalvular velocity was within the normal range.   There was no stenosis. There was no regurgitation. - Mitral valve: Transvalvular velocity was within the normal range.   There was no evidence for stenosis. There was no regurgitation. - Right ventricle: The cavity size was normal. Wall thickness was   normal. Systolic function was normal. - Atrial septum: No defect or patent foramen ovale was identified   by color flow Doppler. - Tricuspid valve: There was trivial regurgitation. - Pulmonary arteries: Systolic pressure was within the normal   range. PA peak pressure: 20 mm Hg (S).  He was referred for left heart cath which was normal on 08/13/18 Findings: 3. No angiographically significant coronary artery disease. 4. Normal left ventricular filling pressure. Recommendations: 1. Medical therapy and primary prevention. 2. Follow-up with Dr. 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   Compliance with diet, lifestyle and medications: Yes  He is reassured by his normal cardiac testing and exclusion of heart failure and CAD.  He still finds himself with excessive daytime sleeping severe snoring and a large air leak in his face mask and is yet to see his sleep apnea physician.  His hand is normal after radial access  full pulses and can return to work Past Medical History:  Diagnosis Date  . Chest pain 08/03/2018  . Shortness of breath 08/03/2018    Past Surgical History:  Procedure Laterality Date  . CARDIAC CATHETERIZATION    . LEFT HEART CATH AND CORONARY ANGIOGRAPHY N/A 08/13/2018   Procedure: LEFT HEART CATH AND CORONARY ANGIOGRAPHY;  Surgeon: Yvonne KendallEnd, Christopher, MD;  [facility]: [facility] IN[facility];  Service: Cardiovascular;  Laterality: N/A;  . NO  PAST SURGERIES      Current Medications: Current Meds  Medication Sig  . Magnesium 400 MG CAPS Take 1 capsule by mouth daily.  . potassium chloride SA (KLOR-CON M20) 20 MEQ tablet Take 1 tablet (20 mEq total) by mouth daily.     Allergies:   Patient has no known allergies.   Social History   Socioeconomic History  . Marital status: Single    Spouse name: Not on file  . Number of children: Not on file  . Years of education: Not on file  . Highest education level: Not on file  Occupational History  . Not on file  Social Needs  . Financial resource strain: Not on file  . Food insecurity:    Worry: Not on file    Inability: Not on file  . Transportation needs:    Medical: Not on file    Non-medical: Not on file  Tobacco Use  . Smoking status: Never Smoker  . Smokeless tobacco: Never Used  Substance and Sexual Activity  . Alcohol use: Yes    Comment: occ  . Drug use: Never  . Sexual activity: Not on file  Lifestyle  . Physical activity:    Days per week: Not on file    Minutes per session: Not on file  . Stress: Not on file  Relationships  . Social connections:    Talks on phone: Not on file    Gets together: Not on file    Attends religious service: Not on file    Active member of club or organization: Not on file    Attends meetings of clubs or organizations: Not on file    Relationship status: Not on file  Other Topics Concern  . Not on file  Social History Narrative  . Not on file     Family History: The patient's family history includes Diabetes in his maternal aunt; Heart disease in his mother; Hyperlipidemia in his father and mother; Hypertension in his mother. ROS:   Please see the history of present illness.    All other systems reviewed and are negative.  EKGs/Labs/Other Studies Reviewed:    The following studies were reviewed today:    Recent Labs: 08/03/2018: ALT 12; BUN 14; Creatinine, Ser 1.20; Hemoglobin 13.5; NT-Pro BNP 77; Platelets  402; Potassium 3.9; Sodium 140  Recent Lipid Panel No results found for: CHOL, TRIG, HDL, CHOLHDL, VLDL, LDLCALC, LDLDIRECT  Physical Exam:    VS:  BP 122/76 (BP Location: Right Arm, Patient Position: Sitting, Cuff Size: Large)   Pulse 81   Ht 6\' 1"  (1.854 m)   Wt (!) 380 lb (172.4 kg)   SpO2 96%   BMI 50.13 kg/m     Wt Readings from Last 3 Encounters:  08/16/18 (!) 380 lb (172.4 kg)  08/13/18 (!) 380 lb (172.4 kg)  08/08/18 (!) 385 lb 3.2 oz (174.7 kg)     GEN: Very obese BMI greater than 50 well nourished, well developed in no acute distress HEENT: Normal NECK: No JVD;  No carotid bruits LYMPHATICS: No lymphadenopathy CARDIAC: RRR, no murmurs, rubs, gallops RESPIRATORY:  Clear to auscultation without rales, wheezing or rhonchi  ABDOMEN: Soft, non-tender, non-distended MUSCULOSKELETAL:  No edema; No deformity  SKIN: Warm and dry NEUROLOGIC:  Alert and oriented x 3 PSYCHIATRIC:  Normal affect    Signed, Norman Herrlich, MD  08/16/2018 2:49 PM    [facility]

## 2018-08-16 ENCOUNTER — Encounter: Payer: Self-pay | Admitting: *Deleted

## 2018-08-16 ENCOUNTER — Encounter: Payer: Self-pay | Admitting: Cardiology

## 2018-08-16 ENCOUNTER — Ambulatory Visit (INDEPENDENT_AMBULATORY_CARE_PROVIDER_SITE_OTHER): Payer: Self-pay | Admitting: Cardiology

## 2018-08-16 VITALS — BP 122/76 | HR 81 | Ht 73.0 in | Wt 380.0 lb

## 2018-08-16 DIAGNOSIS — G4733 Obstructive sleep apnea (adult) (pediatric): Secondary | ICD-10-CM

## 2018-08-16 DIAGNOSIS — Z6841 Body Mass Index (BMI) 40.0 and over, adult: Secondary | ICD-10-CM

## 2018-08-16 NOTE — Patient Instructions (Signed)
Medication Instructions:  Your physician has recommended you make the following change in your medication:   STOP potassium chloride 20 mEq daily when you finish the medication you have, do not pick up refill!  If you need a refill on your cardiac medications before your next appointment, please call your pharmacy.   Lab work: None  If you have labs (blood work) drawn today and your tests are completely normal, you will receive your results only by: Marland Kitchen. MyChart Message (if you have MyChart) OR . A paper copy in the mail If you have any lab test that is abnormal or we need to change your treatment, we will call you to review the results.  Testing/Procedures: None  Follow-Up: At [facility], you and your health needs are our priority.  As part of our continuing mission to provide you with exceptional heart care, we have created designated Provider Care Teams.  These Care Teams include your primary Cardiologist (physician) and Advanced Practice Providers (APPs -  Physician Assistants and Nurse Practitioners) who all work together to provide you with the care you need, when you need it. You will need a follow up appointment as needed if symptoms worsen or fail to improve.

## 2018-08-29 ENCOUNTER — Ambulatory Visit: Payer: Self-pay | Admitting: Cardiology

## 2018-09-06 NOTE — Progress Notes (Signed)
Cardiology Office Note:    Date:  09/07/2018   ID:  Jack LeitzMichael L Ellwanger Jr., DOB 06/25/1976, MRN [id number]  PCP:  Patient, No Pcp Per  Cardiologist:  Norman HerrlichBrian Elzia Hott, MD    Referring MD: No ref. provider found    ASSESSMENT:    1. Shortness of breath   2. Obstructive sleep apnea syndrome    PLAN:    In order of problems listed above:  1. He remains quite short of breath intermittently as well as chronically and elevated but he is not in the high risk group he does extended immobilization [place] and despite a normal d-dimer 11 urgent CTA of his chest performed.  If normal I think the problem here is obesity hypoventilation syndrome will refer to pulmonary as quickly as possible 2. Improved he is wearing his CPAP   Next appointment: 2 to 3 weeks   Medication Adjustments/Labs and Tests Ordered: Current medicines are reviewed at length with the patient today.  Concerns regarding medicines are outlined above.  No orders of the defined types were placed in this encounter.  No orders of the defined types were placed in this encounter.   Chief Complaint  Patient presents with  . Follow-up  . Shortness of Breath    History of Present Illness:    Jack LeitzMichael L Jack Jr. is a 42 y.o. male with a hx of shortness of breath and chest pain initially seen 08/03/2018 appeared to be in decompensated heart failure was started as a loop diuretic proBNP level subsequently was low and consistent with heart failure and echocardiogram does not show findings of systolic or diastolic heart failure or pulmonary artery hypertension. Jack Buckley. He was [facility]2018 for the same complaints troponin and proBNP were normal CT of the chest showed no evidence of pulmonary embolism or lung disease.   Echo 08/07/18: Study Conclusions - Left ventricle: The cavity size was normal. There was mild   concentric hypertrophy. Systolic function was normal. The  estimated ejection fraction was in the range of 60% to 65%. Wall   motion was normal; there were no regional wall motion   abnormalities. Left ventricular diastolic function parameters   were normal. - Aortic valve: Transvalvular velocity was within the normal range.   There was no stenosis. There was no regurgitation. - Mitral valve: Transvalvular velocity was within the normal range.   There was no evidence for stenosis. There was no regurgitation. - Right ventricle: The cavity size was normal. Wall thickness was   normal. Systolic function was normal. - Atrial septum: No defect or patent foramen ovale was identified   by color flow Doppler. - Tricuspid valve: There was trivial regurgitation. - Pulmonary arteries: Systolic pressure was within the normal   range. PA peak pressure: 20 mm Hg (S).   He was referred for left heart cath which was normal on 08/13/18 Findings: No angiographically significant coronary artery disease. Normal left ventricular filling pressure. Recommendations: 1. Medical therapy and primary prevention. 2. Follow-up with Dr. 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   He was  last seen 08/16/18. Compliance with diet, lifestyle and medications: Yes  He has recent [place] to the [place] in [place] with chest pain EKG cardiac enzymes normal BNP level was 5 and recommended to undergo endoscopic evaluation for chest pain advised to see his physician when [place].  He has been seen  by sleep medicine at [facility] to address his untreated sleep apnea.  He has been evaluated noninvasively and invasively and has no evidence of heart failure or PAH.  He had driven to the [facility] and says that he was increasingly short of breath with altitude improvement he drive down to the [facility] symptoms are so severe on the [place] that he rested for several days and a recurrent driving into [place] and caused him to go  the emergency room he says he is very apprehensive and almost panic when he is breathless but does not think panic is the etiology he also had some mild chest tightness.  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  He will undergo urgent CTA today if that is normal referral to pulmonary for obesity hypoventilation syndrome.  I did not repeat laboratory work or EKG today Past Medical History:  Diagnosis Date  . Chest pain 08/03/2018  . Shortness of breath 08/03/2018    Past Surgical History:  Procedure Laterality Date  . CARDIAC CATHETERIZATION    . LEFT HEART CATH AND CORONARY ANGIOGRAPHY N/A 08/13/2018   Procedure: LEFT HEART CATH AND CORONARY ANGIOGRAPHY;  Surgeon: Yvonne Kendall, MD;  Location: [facility] IN[facility];  Service: Cardiovascular;  Laterality: N/A;  . NO PAST SURGERIES      Current Medications: Current Meds  Medication Sig  . famotidine (PEPCID) 20 MG tablet Take 20 mg by mouth 2 (two) times daily.  . Magnesium 400 MG CAPS Take 1 capsule by mouth daily.  . potassium chloride SA (KLOR-CON M20) 20 MEQ tablet Take 1 tablet (20 mEq total) by mouth daily.     Allergies:   Patient has no known allergies.   Social History   Socioeconomic History  . Marital status: Single    Spouse name: Not on file  . Number of children: Not on file  . Years of education: Not on file  . Highest education level: Not on file  Occupational History  . Not on file  Social Needs  . Financial resource strain: Not on file  . Food insecurity:    Worry: Not on file    Inability: Not on file  . Transportation needs:    Medical: Not on file    Non-medical: Not on file  Tobacco Use  . Smoking status: Never Smoker  . Smokeless tobacco: Never Used  Substance and Sexual Activity  . Alcohol use: Yes    Comment: occ  . Drug use: Never  . Sexual  activity: Not on file  Lifestyle  . Physical activity:    Days per week: Not on file    Minutes per session: Not on file  . Stress: Not on file  Relationships  . Social connections:    Talks on phone: Not on file    Gets together: Not on file    Attends religious service: Not on file    Active member of club or organization: Not on file    Attends meetings of clubs or organizations: Not on file    Relationship status: Not on file  Other Topics Concern  . Not on file  Social History Narrative  . Not on file     Family History: The patient's family history includes Diabetes in his maternal aunt; Heart disease in his mother; Hyperlipidemia in his  father and mother; Hypertension in his mother. ROS:   Please see the history of present illness.    All other systems reviewed and are negative.  EKGs/Labs/Other Studies Reviewed:    The following studies were reviewed today:  EKG:  [place] [place] [facility] [place] sinus rhythm normal  Recent Labs:   08/05/18 CBC and CMP are normal 08/03/2018: ALT 12; BUN 14; Creatinine, Ser 1.20; Hemoglobin 13.5; NT-Pro BNP 77; Platelets 402; Potassium 3.9; Sodium 140  Recent Lipid Panel No results found for: CHOL, TRIG, HDL, CHOLHDL, VLDL, LDLCALC, LDLDIRECT  Physical Exam:    VS:  BP 118/72   Pulse 81   Ht 6\' 1"  (1.854 m)   Wt (!) 361 lb 12.8 oz (164.1 kg)   SpO2 95%   BMI 47.73 kg/m     Wt Readings from Last 3 Encounters:  09/07/18 (!) 361 lb 12.8 oz (164.1 kg)  08/16/18 (!) 380 lb (172.4 kg)  08/13/18 (!) 380 lb (172.4 kg)     GEN: Anxious appears breathless morbidly obese  HEENT: Normal NECK: No JVD; No carotid bruits LYMPHATICS: No lymphadenopathy CARDIAC: RRR, no murmurs, rubs, gallops RESPIRATORY:  Clear to auscultation without rales, wheezing or rhonchi  ABDOMEN: Soft, non-tender, non-distended MUSCULOSKELETAL:  No edema; No deformity no palpable cord or calf tenderness SKIN: Warm and dry NEUROLOGIC:  Alert and oriented x  3 PSYCHIATRIC:  Normal affect    Signed, Norman HerrlichBrian Reannon Candella, MD  09/07/2018 1:48 PM    [facility]

## 2018-09-07 ENCOUNTER — Ambulatory Visit (INDEPENDENT_AMBULATORY_CARE_PROVIDER_SITE_OTHER): Payer: Self-pay | Admitting: Cardiology

## 2018-09-07 ENCOUNTER — Encounter: Payer: Self-pay | Admitting: *Deleted

## 2018-09-07 ENCOUNTER — Ambulatory Visit (HOSPITAL_BASED_OUTPATIENT_CLINIC_OR_DEPARTMENT_OTHER)
Admission: RE | Admit: 2018-09-07 | Discharge: 2018-09-07 | Disposition: A | Discharge status: Home / Self Care | Payer: Medicaid Other | Source: Ambulatory Visit | Attending: Cardiology | Admitting: Cardiology

## 2018-09-07 ENCOUNTER — Encounter (HOSPITAL_BASED_OUTPATIENT_CLINIC_OR_DEPARTMENT_OTHER): Payer: Self-pay

## 2018-09-07 ENCOUNTER — Telehealth: Payer: Self-pay | Admitting: Cardiology

## 2018-09-07 ENCOUNTER — Telehealth: Payer: Self-pay | Admitting: Pulmonary Disease

## 2018-09-07 ENCOUNTER — Encounter: Payer: Self-pay | Admitting: Cardiology

## 2018-09-07 VITALS — BP 118/72 | HR 81 | Ht 73.0 in | Wt 361.8 lb

## 2018-09-07 DIAGNOSIS — R0602 Shortness of breath: Secondary | ICD-10-CM | POA: Diagnosis not present

## 2018-09-07 DIAGNOSIS — G4733 Obstructive sleep apnea (adult) (pediatric): Secondary | ICD-10-CM

## 2018-09-07 MED ORDER — IOPAMIDOL (ISOVUE-370) INJECTION 76%
100.0000 mL | Freq: Once | INTRAVENOUS | Status: AC | PRN
  Administered 2018-09-07: 78 mL via INTRAVENOUS

## 2018-09-07 NOTE — Telephone Encounter (Signed)
Referral and patient asking for appt ASAP.  Advised would put message back to see if patient can be worked in sooner.  If cannot reach at number given, may reach him at [phone]

## 2018-09-07 NOTE — Patient Instructions (Signed)
Medication Instructions:  Your physician recommends that you continue on your current medications as directed. Please refer to the Current Medication list given to you today.  If you need a refill on your cardiac medications before your next appointment, please call your pharmacy.   Lab work: None  If you have labs (blood work) drawn today and your tests are completely normal, you will receive your results only by: Marland Kitchen. MyChart Message (if you have MyChart) OR . A paper copy in the mail If you have any lab test that is abnormal or we need to change your treatment, we will call you to review the results.  Testing/Procedures: Non-Cardiac CT Angiography (CTA), is a special type of CT scan that uses a computer to produce multi-dimensional views of major blood vessels throughout the body. In CT angiography, a contrast material is injected through an IV to help visualize the blood vessels.  You have been referred to see a pulmonologist. You will be contacted to schedule this appointment.   Follow-Up: At [facility], you and your health needs are our priority.  As part of our continuing mission to provide you with exceptional heart care, we have created designated Provider Care Teams.  These Care Teams include your primary Cardiologist (physician) and Advanced Practice Providers (APPs -  Physician Assistants and Nurse Practitioners) who all work together to provide you with the care you need, when you need it. You will need a follow up appointment in 2 weeks.       CT Angiogram  A CT angiogram is a procedure to look at the blood vessels in various areas of the body. For this procedure, a large X-ray machine, called a CT scanner, takes detailed pictures of blood vessels that have been injected with a dye (contrast material). A CT angiogram allows your health care provider to see how well blood is flowing to the area of your body that is being checked. Your health care provider will be able to see  if there are any problems, such as a blockage. Tell a health care provider about:  Any allergies you have.  All medicines you are taking, including vitamins, herbs, eye drops, creams, and over-the-counter medicines.  Any problems you or family members have had with anesthetic medicines.  Any blood disorders you have.  Any surgeries you have had.  Any medical conditions you have.  Whether you are pregnant or may be pregnant.  Whether you are breastfeeding.  Any anxiety disorders, chronic pain, or other conditions you have that may increase your stress or prevent you from lying still. What are the risks? Generally, this is a safe procedure. However, problems may occur, including:  Infection.  Bleeding.  Allergic reactions to medicines or dyes.  Damage to other structures or organs.  Kidney damage from the dye or contrast that is used.  Increased risk of cancer from radiation exposure. This risk is low. Talk with your health care provider about: ? The risks and benefits of testing. ? How you can receive the lowest dose of radiation. What happens before the procedure?  Wear comfortable clothing and remove any jewelry.  Follow instructions from your health care provider about eating and drinking. For most people, instructions may include these actions: ? For 12 hours before the test, avoid caffeine. This includes tea, coffee, soda, and energy drinks or pills. ? For 3-4 hours before the test, stop eating or drinking anything but water. ? Stay well hydrated by continuing to drink water before the  exam. This will help to clear the contrast dye from your body after the test.  Ask your health care provider about changing or stopping your regular medicines. This is especially important if you are taking diabetes medicines or blood thinners. What happens during the procedure?  An IV tube will be inserted into one of your veins.  You will be asked to lie on an exam table. This  table will slide in and out of the CT machine during the procedure.  Contrast dye will be injected into the IV tube. You might feel warm, or you may get a metallic taste in your mouth.  The table that you are lying on will move into the CT machine tunnel for the scan.  The person running the machine will give you instructions while the scans are being done. You may be asked to: ? Keep your arms above your head. ? Hold your breath. ? Stay very still, even if the table is moving.  When the scanning is complete, you will be moved out of the machine.  The IV tube will be removed. The procedure may vary among health care providers and hospitals. What happens after the procedure?  You might feel warm, or you may get a metallic taste in your mouth.  You may be asked to drink water or other fluids to wash (flush) the contrast material out of your body.  It is up to you to get the results of your procedure. Ask your health care provider, or the department that is doing the procedure, when your results will be ready. Summary  A CT angiogram is a procedure to look at the blood vessels in various areas of the body.  You will need to stay very still during the exam.  You may be asked to drink water or other fluids to wash (flush) the contrast material out of your body after your scan. This information is not intended to replace advice given to you by your health care provider. Make sure you discuss any questions you have with your health care provider. Document Released: 04/28/2016 Document Revised: 04/28/2016 Document Reviewed: 04/28/2016 Elsevier Interactive Patient Education  [employer]2019 [employer] Inc[employer]

## 2018-09-07 NOTE — Telephone Encounter (Signed)
Patient informed of CTA chest PE results and advised to follow up with pulmonology as scheduled on 09/19/2018. Patient will return to see Dr. Dulce SellarMunley on 09/20/2018. Patient verbalized understanding. No further questions.

## 2018-09-07 NOTE — Telephone Encounter (Signed)
Called and spoke with patient, advised him that we do not have any other openings sooner than 09/19/17. Stated that we have one opening on 09/17/17 with MW. Patient denied appointment slot. Advised patient that if his symptoms worsen or new onset happens he needs to be evaluated in the ED. Patient verbalized understanding. Nothing further needed.

## 2018-09-10 ENCOUNTER — Telehealth: Payer: Self-pay | Admitting: Cardiology

## 2018-09-10 NOTE — Telephone Encounter (Signed)
Patient stated he has several questions for you.

## 2018-09-10 NOTE — Telephone Encounter (Signed)
Patient states he is having left-sided weakness and numbness. He reports "heaviness" when he is talking and mentioned he feels like he has been slurring his words intermittently. Patient states this has been going on since seeing Dr. Dulce SellarMunley but has never mentioned it to Dr. Dulce SellarMunley. Advised patient that he should go to the nearest emergency department to be evaluated to rule out stroke. Patient agreeable and verbalized understanding. No further questions.

## 2018-09-19 ENCOUNTER — Encounter: Payer: Self-pay | Admitting: Pulmonary Disease

## 2018-09-19 ENCOUNTER — Telehealth: Payer: Self-pay | Admitting: *Deleted

## 2018-09-19 ENCOUNTER — Ambulatory Visit (INDEPENDENT_AMBULATORY_CARE_PROVIDER_SITE_OTHER): Payer: Self-pay | Admitting: Pulmonary Disease

## 2018-09-19 VITALS — BP 114/76 | HR 90

## 2018-09-19 DIAGNOSIS — R1013 Epigastric pain: Secondary | ICD-10-CM

## 2018-09-19 DIAGNOSIS — R0602 Shortness of breath: Secondary | ICD-10-CM

## 2018-09-19 NOTE — Patient Instructions (Signed)
Persistent dyspepsia symptoms Shortness of breath  CT scan of the chest is completely normal, no PE, no anatomical abdomen is noted  Will refer you to GI for further evaluation  Continue medications for reflux We will obtain a breathing study  Tentatively we will see you back in about 3 months

## 2018-09-19 NOTE — Telephone Encounter (Signed)
Called to cancel patient's follow up appointment for tomorrow, 09/20/2018 per Dr. Dulce Sellar because everything from a cardiac standpoint has been ruled out. Patient's shortness of breath was addressed today at the pulmonology office.  Patient has requested a referral be placed for gastroenterology and Dr. Dulce Sellar was agreeable; however, a GI referral was already placed by pulmonology today.   Patient verbalized understanding. No further questions.

## 2018-09-19 NOTE — Progress Notes (Signed)
Jack Buckley    [id number]    1976/03/20  Primary Care Physician:Munley, Iline Oven, MD  Referring Physician: Baldo Daub, MD [address] [place], [place] [address]  Chief complaint:   Patient being seen for some shortness of breath, has been having dyspepsia symptoms, central chest discomfort  HPI:  Was evaluated by cardiology and told that things were fine Started on Pepcid for reflux Noted slight improvement in symptoms Denies underlying lung disease Never smoker Maybe had bronchitis once in the past Not exposed to significant secondhand smoke  Has obstructive sleep apnea for which he has been on CPAP Not having any difficulty tolerating CPAP, not having any problems with sleepiness Feels his CPAP is working well  Outpatient Encounter Medications as of 09/19/2018  Medication Sig  . famotidine (PEPCID) 20 MG tablet Take 20 mg by mouth 2 (two) times daily.  . Magnesium 400 MG CAPS Take 1 capsule by mouth daily.  . potassium chloride SA (KLOR-CON M20) 20 MEQ tablet Take 1 tablet (20 mEq total) by mouth daily.   No facility-administered encounter medications on file as of 09/19/2018.     Allergies as of 09/19/2018  . (No Known Allergies)    Past Medical History:  Diagnosis Date  . Chest pain 08/03/2018  . Shortness of breath 08/03/2018    Past Surgical History:  Procedure Laterality Date  . CARDIAC CATHETERIZATION    . LEFT HEART CATH AND CORONARY ANGIOGRAPHY N/A 08/13/2018   Procedure: LEFT HEART CATH AND CORONARY ANGIOGRAPHY;  Surgeon: Yvonne Kendall, MD;  Location: [facility];  Service: Cardiovascular;  Laterality: N/A;  . NO PAST SURGERIES      Family History  Problem Relation Age of Onset  . Heart disease Mother   . Hypertension Mother   . Hyperlipidemia Mother   . Hyperlipidemia Father   . Diabetes Maternal Aunt     Social History   Socioeconomic History  . Marital status: Married    Spouse name: Not on  file  . Number of children: Not on file  . Years of education: Not on file  . Highest education level: Not on file  Occupational History  . Not on file  Social Needs  . Financial resource strain: Not on file  . Food insecurity:    Worry: Not on file    Inability: Not on file  . Transportation needs:    Medical: Not on file    Non-medical: Not on file  Tobacco Use  . Smoking status: Never Smoker  . Smokeless tobacco: Never Used  Substance and Sexual Activity  . Alcohol use: Yes    Comment: occ  . Drug use: Never  . Sexual activity: Not on file  Lifestyle  . Physical activity:    Days per week: Not on file    Minutes per session: Not on file  . Stress: Not on file  Relationships  . Social connections:    Talks on phone: Not on file    Gets together: Not on file    Attends religious service: Not on file    Active member of club or organization: Not on file    Attends meetings of clubs or organizations: Not on file    Relationship status: Not on file  . Intimate partner violence:    Fear of current or ex partner: Not on file    Emotionally abused: Not on file  Physically abused: Not on file    Forced sexual activity: Not on file  Other Topics Concern  . Not on file  Social History Narrative  . Not on file    Review of Systems  HENT: Negative.   Eyes: Negative.   Respiratory: Positive for shortness of breath.   Cardiovascular: Negative.   Gastrointestinal: Positive for abdominal pain.       Symptoms of dyspepsia  Endocrine: Negative.     Vitals:   09/19/18 1540  BP: 114/76  Pulse: 90  SpO2: 96%     Physical Exam  Constitutional: He appears well-developed.  Obese  HENT:  Head: Normocephalic and atraumatic.  Eyes: Pupils are equal, round, and reactive to light. Conjunctivae are normal. Right eye exhibits no discharge. Left eye exhibits no discharge.  Neck: Normal range of motion. Neck supple. No tracheal deviation present. No thyromegaly present.    Cardiovascular: Normal rate and regular rhythm.  Pulmonary/Chest: Effort normal and breath sounds normal. No respiratory distress. He has no wheezes. He has no rales.  Abdominal: Soft. Bowel sounds are normal. He exhibits no distension. There is no abdominal tenderness. There is no rebound.   Data Reviewed:  Recent CT scan of the chest reviewed-there is no PE, no infiltrate  Assessment:   Shortness of breath, chest tightness  Dyspepsia  Reflux  Obstructive sleep apnea-compliant with CPAP, controled symptoms  Plan/Recommendations:  I am not sure Mr. Jack Buckley has any underlying lung disease Normal CT scan of the chest No previous history of breathing problems  We will obtain a pulmonary function study  We will refer to GI for further evaluation of his dyspepsia  He should continue on his reflux medication  A PPI may be considered  I did encourage him to continue on his weight loss efforts  There may be a component of anxiety causing him to be more symptomatic at present, being on an anxiolytic may help   Virl Diamond MD [facility] 09/19/2018, 4:02 PM  CC: Baldo Daub, MD

## 2018-09-20 ENCOUNTER — Ambulatory Visit: Payer: Self-pay | Admitting: Cardiology

## 2018-09-21 ENCOUNTER — Telehealth: Payer: Self-pay | Admitting: Pulmonary Disease

## 2018-09-21 NOTE — Telephone Encounter (Signed)
Spoke with patient. He stated that AO was supposed to refer him to a [facility] doctor. He hasn't heard anything. Advised him that the order had been placed. Gave him the number to [facility] and advised him to call them to get scheduled. He verbalized understanding. Nothing further needed at time of call.

## 2018-09-28 ENCOUNTER — Encounter: Payer: Self-pay | Admitting: Gastroenterology

## 2018-09-28 ENCOUNTER — Other Ambulatory Visit (INDEPENDENT_AMBULATORY_CARE_PROVIDER_SITE_OTHER): Payer: Self-pay

## 2018-09-28 ENCOUNTER — Ambulatory Visit (INDEPENDENT_AMBULATORY_CARE_PROVIDER_SITE_OTHER): Payer: Self-pay | Admitting: Gastroenterology

## 2018-09-28 VITALS — BP 100/68 | HR 66 | Ht 72.0 in | Wt 349.0 lb

## 2018-09-28 DIAGNOSIS — R194 Change in bowel habit: Secondary | ICD-10-CM

## 2018-09-28 DIAGNOSIS — K219 Gastro-esophageal reflux disease without esophagitis: Secondary | ICD-10-CM

## 2018-09-28 DIAGNOSIS — R634 Abnormal weight loss: Secondary | ICD-10-CM

## 2018-09-28 DIAGNOSIS — R0789 Other chest pain: Secondary | ICD-10-CM

## 2018-09-28 DIAGNOSIS — R1110 Vomiting, unspecified: Secondary | ICD-10-CM

## 2018-09-28 DIAGNOSIS — Z7729 Contact with and (suspected ) exposure to other hazardous substances: Secondary | ICD-10-CM

## 2018-09-28 DIAGNOSIS — R197 Diarrhea, unspecified: Secondary | ICD-10-CM

## 2018-09-28 DIAGNOSIS — K625 Hemorrhage of anus and rectum: Secondary | ICD-10-CM

## 2018-09-28 LAB — TSH: TSH: 0.11 u[IU]/mL — ABNORMAL LOW (ref 0.35–4.50)

## 2018-09-28 LAB — HIGH SENSITIVITY CRP: CRP, High Sensitivity: 7.34 mg/L — ABNORMAL HIGH (ref 0.000–5.000)

## 2018-09-28 MED ORDER — OMEPRAZOLE 40 MG PO CPDR: 40.0000 mg | DELAYED_RELEASE_CAPSULE | Freq: Two times a day (BID) | ORAL | 3 refills | Status: DC

## 2018-09-28 NOTE — Patient Instructions (Signed)
Your provider has requested that you go to the basement level for lab work before leaving today. Press "B" on the elevator. The lab is located at the first door on the left as you exit the elevator.  We have sent the following medications to your pharmacy for you to pick up at your convenience:  Follow up on 11/02/18 at 1:30pm with Dr Meridee Score  Thank you for entrusting me with your care and choosing  Health care.  Dr Meridee Score

## 2018-09-28 NOTE — Progress Notes (Signed)
GASTROENTEROLOGY OUTPATIENT CLINIC VISIT   Primary Care Provider Baldo Daub, MD [address] [place] [address] [phone]  Referring Provider Baldo Daub, MD [address] [place], [place] [address] [phone]  Patient Profile: Jack Buckley. is a 43 y.o. male with a pmh significant for obesity, sleep apnea.  The patient presents to the [facility] for an evaluation and management of problem(s) noted below:  Problem List 1. Gastroesophageal reflux disease, esophagitis presence not specified   2. Non-cardiac chest pain   3. Unintentional weight loss   4. Regurgitation of food   5. Change in bowel habits   6. Rectal bleeding     History of Present Illness: This is the patient's first visit to the outpatient of our GI clinic.  The patient is accompanied by his wife today.  Patient describes beginning in September the acute onset of chest pains and chest tightness as well as discomfort in the substernal region subxiphoid region.  He states since September his appetite has been decreased and he has been experiencing early satiety.  He has significant anxiety and is very concerned about this.  States in November his weight was approximately 384 pounds and subsequently over the course of the next few months up until today he has had a significant weight loss of approximately 40 pounds.  This has mostly been unintentional and he is concerned about why this is occurring.  The patient has been experiencing infrequent though present episodes of solid food dysphagia for which he will need to regurgitate.  He has tried to stay away from acidic foods because he has experienced heartburn reflux.  The patient does not follow significant lifestyle modifications for heartburn.  He has not been on significant PPI therapy.  He has never had coffee-ground emesis or hematemesis.  He describes mild dyspepsia/indigestion that occurs as  well.  Patient states that over the course of these few months he has been experiencing a change in his bowel habits and is having more loose stools.  He is wiped his bottom and noted on a relatively frequent basis that he has blood on the toilet paper but not in the toilet bowl.  He is never had overt hematochezia.  He denies any melena.  The patient has never had an upper or lower endoscopy.  He has taken NSAIDs in the past.  The patient has had a cardiac evaluation including a cardiac catheterization which did not show any significant issues.  He is undergone a pulmonary evaluation with a noncontrasted CT and consultation with pulmonary which has been nonrevealing as well.  GI Review of Systems Positive as above Negative for odynophagia, jaundice, fecal urgency, tenesmus  Review of Systems General: Denies fevers/chills HEENT: Denies oral lesion Cardiovascular: Denies current chest pain/palpitations Pulmonary: Denies shortness of breath/cough Gastroenterological: See HPI Genitourinary: Denies darkened urine or hematuria Hematological: Denies easy bruising/bleeding Endocrine: Positive for temperature intolerance Dermatological: Denies jaundice Psychological: Mood is stable   Medications Current Outpatient Medications  Medication Sig Dispense Refill  . diphenhydrAMINE HCl (BENADRYL ALLERGY PO) Take 1 tablet by mouth daily as needed.    . famotidine (PEPCID) 20 MG tablet Take 20 mg by mouth as needed.     . Magnesium 400 MG CAPS Take 1 capsule by mouth daily.    . potassium chloride SA (KLOR-CON M20) 20 MEQ tablet Take 1 tablet (20 mEq total) by mouth daily. 30 tablet 3  . omeprazole (  PRILOSEC) 40 MG capsule Take 1 capsule (40 mg total) by mouth 2 (two) times daily for 30 days. 60 capsule 3   No current facility-administered medications for this visit.     Allergies No Known Allergies  Histories Past Medical History:  Diagnosis Date  . Chest pain 08/03/2018  . Shortness of breath  08/03/2018   Past Surgical History:  Procedure Laterality Date  . CARDIAC CATHETERIZATION    . LEFT HEART CATH AND CORONARY ANGIOGRAPHY N/A 08/13/2018   Procedure: LEFT HEART CATH AND CORONARY ANGIOGRAPHY;  Surgeon: Yvonne KendallEnd, Christopher, MD;  Location: [facility];  Service: Cardiovascular;  Laterality: N/A;  . NO PAST SURGERIES     Social History   Socioeconomic History  . Marital status: Married    Spouse name: Not on file  . Number of children: Not on file  . Years of education: Not on file  . Highest education level: Not on file  Occupational History  . Not on file  Social Needs  . Financial resource strain: Not on file  . Food insecurity:    Worry: Not on file    Inability: Not on file  . Transportation needs:    Medical: Not on file    Non-medical: Not on file  Tobacco Use  . Smoking status: Never Smoker  . Smokeless tobacco: Never Used  Substance and Sexual Activity  . Alcohol use: Yes    Comment: occ  . Drug use: Never  . Sexual activity: Not on file  Lifestyle  . Physical activity:    Days per week: Not on file    Minutes per session: Not on file  . Stress: Not on file  Relationships  . Social connections:    Talks on phone: Not on file    Gets together: Not on file    Attends religious service: Not on file    Active member of club or organization: Not on file    Attends meetings of clubs or organizations: Not on file    Relationship status: Not on file  . Intimate partner violence:    Fear of current or ex partner: Not on file    Emotionally abused: Not on file    Physically abused: Not on file    Forced sexual activity: Not on file  Other Topics Concern  . Not on file  Social History Narrative  . Not on file   Family History  Problem Relation Age of Onset  . Heart disease Mother   . Hypertension Mother   . Hyperlipidemia Mother   . Hyperlipidemia Father   . Diabetes Maternal Aunt   . Colon cancer Neg Hx   . Esophageal cancer Neg Hx   .  Inflammatory bowel disease Neg Hx   . Liver disease Neg Hx   . Pancreatic cancer Neg Hx   . Rectal cancer Neg Hx   . Stomach cancer Neg Hx    I have reviewed his medical, social, and family history in detail and updated the electronic medical record as necessary.    PHYSICAL EXAMINATION  BP 100/68   Pulse 66   Ht 6' (1.829 m)   Wt (!) 349 lb (158.3 kg)   BMI 47.33 kg/m  Wt Readings from Last 3 Encounters:  09/28/18 (!) 349 lb (158.3 kg)  09/07/18 (!) 361 lb 12.8 oz (164.1 kg)  08/16/18 (!) 380 lb (172.4 kg)  GEN: NAD, appears stated age, doesn't appear chronically ill PSYCH: Cooperative, though appears somewhat anxious as  is his wife EYE: Conjunctivae pink, sclerae anicteric ENT: MMM, without oral ulcers, no erythema or exudates noted NECK: Supple, enlarged neck girth CV: RR without R/Gs  RESP: CTAB posteriorly, without wheezing GI: NABS, soft, rounded, obese, ND, without rebound or guarding, unable to appreciate hepatosplenomegaly due to body habitus MSK/EXT: Trace bilateral lower extremity edema SKIN: No jaundice NEURO:  Alert & Oriented x 3, no focal deficits   REVIEW OF DATA  I reviewed the following data at the time of this encounter:  GI Procedures and Studies  No relevant studies to review  Laboratory Studies  Reviewed in epic  Imaging Studies  12/19 CT-Chest IMPRESSION: 1. No demonstrable pulmonary embolus. No thoracic aortic aneurysm or dissection. 2.  Lungs clear. 3.  No demonstrable thoracic adenopathy.  12/19 RUQUS FINDINGS: Gallbladder: No gallstones or wall thickening visualized. No sonographic Murphy sign noted by sonographer. Common bile duct: Diameter: 0.3 cm. Liver: No focal lesion identified. Within normal limits in parenchymal echogenicity. Portal vein is patent on color Doppler imaging with normal direction of blood flow towards the liver. IMPRESSION: Normal right upper quadrant ultrasound.   ASSESSMENT  Mr. Ladona Ridgelaylor is a 43 y.o.  male with a pmh significant for obesity, sleep apnea.  The patient is seen today for evaluation and management of:  1. Gastroesophageal reflux disease, esophagitis presence not specified   2. Non-cardiac chest pain   3. Unintentional weight loss   4. Regurgitation of food   5. Change in bowel habits   6. Rectal bleeding    The patient is hemodynamically stable however he does have some red flag symptoms in regards to an unintentional weight loss as well as some infrequent though present solid food dysphagia.  His changes in bowel habits are also concerning but not to the degree that I would normally suspect as being able to find an etiology for potential symptoms.  With that being said I would like to have the patient be on high-dose acid suppression therapy for the next 4 weeks to evaluate how his symptoms are as well as how he is weight improves.  I would like to do some basic laboratories to rule out hyperthyroidism as well as get a sense of degree of inflammation that may be occurring in the body in thoughts of possible inflammatory bowel disease though less likely.  We will obtain stool studies as well to evaluate ensure patient does not have an infectious process that is a cause for his change in bowel habits so I suspect this is less likely as well.  Based on how the patient does we will consider the role of both an upper and lower endoscopy for diagnostic purposes.  If this is negative we will consider the role of a cross-sectional CT abdomen pelvis with contrast.  Esophageal spasms as a possible etiology for his noncardiac chest pain will be considered.  We will hold on manometry testing.  Based on the patient's symptoms of early satiety I wonder about the possibility of gastroparesis though he does not carry a diagnosis of diabetes.  The risks and benefits of endoscopic evaluation were discussed with the patient; these include but are not limited to the risk of perforation, infection, bleeding,  missed lesions, lack of diagnosis, severe illness requiring hospitalization, as well as anesthesia and sedation related illnesses.  The patient is agreeable to proceed.  All patient questions were answered, to the best of my ability, and the patient agrees to the aforementioned plan of action with follow-up as  indicated.   PLAN  Laboratories as outlined below Stool studies as outlined below PPI 40 mg twice daily Follow-up in approximately 4 weeks and based on patient's persistence of symptoms we will consider a diagnostic upper endoscopy as well as diagnostic colonoscopy We will consider the role of an abdominal CT abdomen pelvis with contrast based on the work-up above as well as a solid food gastric emptying study and possible manometry GERD lifestyle modifications were discussed as below -Eat meals >3 hours before bedtime -Do not lay down or lie flat immediately after eating for at least 2-hours -Do not overeat (decrease portion size and/or eat more frequent smaller meals) -Eat slowly -Wear Loose-fitting clothes -Raise Head of Bed (Head & Chest > Feet with bed blocks) -Avoid foods that trigger the symptoms (Onions, Chocolate, Caffeine, Spicy, and Fatty) -Work on Losing [employer] -Avoid Alcohol -Increase Exercise Activity -Maintain Heartburn Diary/Log 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 Stool culture  . Ova and parasite examination  . TSH  . High sensitivity CRP  . Calcium, ionized  . Carbon Monoxide, Blood  . Fecal Lactoferrin    New Prescriptions   OMEPRAZOLE (PRILOSEC) 40 MG CAPSULE    Take 1 capsule (40 mg total) by mouth 2 (two) times daily for 30 days.   Modified Medications   No medications on file    Planned Follow  Up: No follow-ups on file.   Corliss Parish, MD [facility] Office # [phone]

## 2018-09-29 LAB — CALCIUM, IONIZED: Calcium, Ion: 5.25 mg/dL (ref 4.8–5.6)

## 2018-10-01 ENCOUNTER — Other Ambulatory Visit: Payer: Self-pay

## 2018-10-01 ENCOUNTER — Telehealth: Payer: Self-pay | Admitting: Gastroenterology

## 2018-10-01 ENCOUNTER — Encounter: Payer: Self-pay | Admitting: Gastroenterology

## 2018-10-01 ENCOUNTER — Other Ambulatory Visit (INDEPENDENT_AMBULATORY_CARE_PROVIDER_SITE_OTHER): Payer: Self-pay

## 2018-10-01 DIAGNOSIS — R634 Abnormal weight loss: Secondary | ICD-10-CM

## 2018-10-01 DIAGNOSIS — Z7729 Contact with and (suspected ) exposure to other hazardous substances: Secondary | ICD-10-CM | POA: Insufficient documentation

## 2018-10-01 DIAGNOSIS — K625 Hemorrhage of anus and rectum: Secondary | ICD-10-CM

## 2018-10-01 DIAGNOSIS — R194 Change in bowel habit: Secondary | ICD-10-CM | POA: Insufficient documentation

## 2018-10-01 DIAGNOSIS — R0789 Other chest pain: Secondary | ICD-10-CM

## 2018-10-01 DIAGNOSIS — R1110 Vomiting, unspecified: Secondary | ICD-10-CM | POA: Insufficient documentation

## 2018-10-01 DIAGNOSIS — R7989 Other specified abnormal findings of blood chemistry: Secondary | ICD-10-CM

## 2018-10-01 DIAGNOSIS — K219 Gastro-esophageal reflux disease without esophagitis: Secondary | ICD-10-CM

## 2018-10-01 LAB — T4, FREE: Free T4: 0.85 ng/dL (ref 0.60–1.60)

## 2018-10-01 LAB — T3, FREE: T3, Free: 3.4 pg/mL (ref 2.3–4.2)

## 2018-10-01 NOTE — Telephone Encounter (Signed)
The pt was given the lab results and questions encouraged.  The pt has been advised of the information and verbalized understanding.

## 2018-10-01 NOTE — Telephone Encounter (Signed)
Pt is in office.  Pt had OV 1.17.20  With Dr. Meridee Score and would like to know the results of his lab.

## 2018-10-02 LAB — CARBON MONOXIDE, BLOOD (PERFORMED AT REF LAB): CARBON MONOXIDE, BLOOD: 2.9 % (ref 0.0–3.6)

## 2018-10-04 ENCOUNTER — Institutional Professional Consult (permissible substitution): Payer: Self-pay | Admitting: Pulmonary Disease

## 2018-10-04 ENCOUNTER — Telehealth: Payer: Self-pay | Admitting: Gastroenterology

## 2018-10-04 NOTE — Telephone Encounter (Signed)
The pt wanted to see if we checked his immune system for auto immune disorders.  I advised him if he had questions regarding auto immune disorders he would need to speak with his PCP.  The pt has been advised of the information and verbalized understanding.

## 2018-10-04 NOTE — Telephone Encounter (Signed)
Pt has additional questions about his results.

## 2018-10-05 LAB — STOOL CULTURE
MICRO NUMBER:: 77792
MICRO NUMBER:: 77793
MICRO NUMBER:: 77795
SHIGA RESULT:: NOT DETECTED
SPECIMEN QUALITY:: ADEQUATE
SPECIMEN QUALITY:: ADEQUATE
SPECIMEN QUALITY:: ADEQUATE

## 2018-10-05 LAB — OVA AND PARASITE EXAMINATION
CONCENTRATE RESULT:: NONE SEEN
MICRO NUMBER:: 77794
SPECIMEN QUALITY:: ADEQUATE
TRICHROME RESULT:: NONE SEEN

## 2018-10-05 LAB — FECAL LACTOFERRIN, QUANT
Fecal Lactoferrin: POSITIVE — AB
MICRO NUMBER:: 77904
SPECIMEN QUALITY:: ADEQUATE

## 2018-10-05 LAB — T4: T4, Total: 7.5 ug/dL (ref 4.9–10.5)

## 2018-11-02 ENCOUNTER — Ambulatory Visit: Payer: Self-pay | Admitting: Gastroenterology

## 2018-11-27 ENCOUNTER — Telehealth: Payer: Self-pay

## 2018-11-27 NOTE — Telephone Encounter (Signed)
Covid-19 travel screening questions  Have you traveled in the last 14 days?  If yes where?  Do you now or have you had a fever in the last 14 days?  Do you have any respiratory symptoms of shortness of breath or cough now or in the last 14 days?  Do you have a medical history of Congestive Heart Failure?  Do you have a medical history of lung disease?  Do you have any family members or close contacts with diagnosed or suspected Covid-19?  Left message for the patient if they answer YES to any of these questions to call us back.

## 2018-11-27 NOTE — Telephone Encounter (Signed)
Pt returned call, he answered "no" to all screening questions.

## 2018-11-28 ENCOUNTER — Other Ambulatory Visit: Payer: Self-pay

## 2018-11-28 ENCOUNTER — Encounter: Payer: Self-pay | Admitting: Physician Assistant

## 2018-11-28 ENCOUNTER — Ambulatory Visit (INDEPENDENT_AMBULATORY_CARE_PROVIDER_SITE_OTHER): Payer: Self-pay | Admitting: Physician Assistant

## 2018-11-28 VITALS — BP 130/70 | HR 82 | Temp 98.4°F | Ht 72.0 in | Wt 343.0 lb

## 2018-11-28 DIAGNOSIS — R634 Abnormal weight loss: Secondary | ICD-10-CM

## 2018-11-28 DIAGNOSIS — K219 Gastro-esophageal reflux disease without esophagitis: Secondary | ICD-10-CM

## 2018-11-28 DIAGNOSIS — R0789 Other chest pain: Secondary | ICD-10-CM

## 2018-11-28 DIAGNOSIS — R1013 Epigastric pain: Secondary | ICD-10-CM

## 2018-11-28 MED ORDER — OMEPRAZOLE 40 MG PO CPDR: 40.0000 mg | DELAYED_RELEASE_CAPSULE | Freq: Two times a day (BID) | ORAL | 3 refills | Status: DC

## 2018-11-28 NOTE — Progress Notes (Signed)
Subjective:    Patient ID: Jack Leitz., male    DOB: November 22, 1975, 43 y.o.   MRN: [id number]  HPI Jack Buckley is a pleasant 43 year old African-American male, recently established with Dr. Meridee Score comes in today for scheduled follow-up.  He had initially been seen in January 2020 with complaints of what was felt to be noncardiac chest pain, regurgitation, changes in bowel habits and weight loss of about 40 pounds. He had had upper abdominal ultrasound in December 2019 which was read as normal. At the time of office visit he was started on twice daily 40 mg omeprazole.  He had thyroid studies done which were normal,  He did have stool culture done which was negative, stool for O&P negative, and fecal lactoferrin was positive.  Pt  had requested a carbon monoxide level which was normal. He comes back in today stating that the omeprazole does not seem to be helping his symptoms.  He thinks he may be having less acid reflux but is continuing to have what he describes as pain in his lower chest and upper abdomen.  He says he has been having some of the symptoms since September, not constant but somewhat progressive and occurring daily.  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  He says his appetite is okay but he is unable to eat like he used to and says if he overeats he definitely has increase in symptoms.  He is having frequent nausea without vomiting, and has lost another 6 pounds since he was seen in the office in January. Bowel movements have been normal, he has not noted any melena or hematochezia. He did go to [facility] on [address] last week because he was continuing to have pain and had CT of the abdomen done at [facility] in [place].  This was a CT of the abdomen with IV and oral contrast.  Lung bases were clear,  small hiatal hernia present, exam was otherwise unremarkable.   Review of Systems Pertinent positive and negative review of systems were noted in the above HPI section.  All other review of systems was otherwise negative.  Outpatient Encounter Medications as of 11/28/2018  Medication Sig  . Magnesium 400 MG CAPS Take 1 capsule by mouth daily.  Marland Kitchen omeprazole (PRILOSEC) 40 MG capsule Take 1 capsule (40 mg total) by mouth 2 (two) times daily before a meal.  . potassium chloride SA (KLOR-CON M20) 20 MEQ tablet Take 1 tablet (20 mEq total) by mouth daily.  . [DISCONTINUED] omeprazole (PRILOSEC) 40 MG capsule Take 40 mg by mouth 2 (two) times daily before a meal.  . [DISCONTINUED] diphenhydrAMINE HCl (BENADRYL ALLERGY PO) Take 1 tablet by mouth daily as needed.  . [DISCONTINUED] famotidine (PEPCID) 20 MG tablet Take 20 mg by mouth as needed.   . [DISCONTINUED] omeprazole (PRILOSEC) 40 MG capsule Take 1 capsule (40 mg total) by mouth 2 (two) times daily for 30 days.   No facility-administered encounter medications on file as of 11/28/2018.    No Known Allergies Patient Active Problem List   Diagnosis Date Noted  . Gastroesophageal reflux disease 10/01/2018  . Regurgitation of food 10/01/2018  . Change in bowel habits 10/01/2018  . Rectal bleeding 10/01/2018  . Unintentional weight loss 10/01/2018  . Exposure to carbon monoxide 10/01/2018  . Adult  BMI 50.0-59.9 kg/sq m (HCC) 08/16/2018  . Non-cardiac chest pain 08/03/2018  . Shortness of breath 08/03/2018  . Sleep apnea 08/03/2018   Social History   Socioeconomic History  . Marital status: Married    Spouse name: Not on file  . Number of children: 1  . Years of education: Not on file  . Highest education level: Not on file  Occupational History  . Occupation: Truck Runner, broadcasting/film/video  . Financial resource strain: Not on file  . Food insecurity:    Worry: Not on file    Inability: Not on file  . Transportation needs:    Medical:  Not on file    Non-medical: Not on file  Tobacco Use  . Smoking status: Never Smoker  . Smokeless tobacco: Never Used  Substance and Sexual Activity  . Alcohol use: Yes    Comment: occ  . Drug use: Never  . Sexual activity: Not on file  Lifestyle  . Physical activity:    Days per week: Not on file    Minutes per session: Not on file  . Stress: Not on file  Relationships  . Social connections:    Talks on phone: Not on file    Gets together: Not on file    Attends religious service: Not on file    Active member of club or organization: Not on file    Attends meetings of clubs or organizations: Not on file    Relationship status: Not on file  . Intimate partner violence:    Fear of current or ex partner: Not on file    Emotionally abused: Not on file    Physically abused: Not on file    Forced sexual activity: Not on file  Other Topics Concern  . Not on file  Social History Narrative  . Not on file    Mr. Huesman's family history includes Diabetes in his maternal aunt; Heart disease in his mother; Hyperlipidemia in his father and mother; Hypertension in his mother.      Objective:    Vitals:   11/28/18 1016  BP: 130/70  Pulse: 82  Temp: 98.4 F (36.9 C)    Physical Exam; well-developed African-American male in no acute distress, pleasant, height 6 foot, weight 343, BMI 46.5.  HEENT; nontraumatic normocephalic EOMI PERRLA sclera anicteric, oral mucosa moist, Cardiovascular ;regular rate and rhythm with S1-S2, Pulmonary; clear bilaterally, Abdomen ;morbidly obese, soft, nondistended, there is no palpable mass or hepatosplenomegaly, he is tender in the epigastrium and hypogastrium.  There is no costal margin tenderness and no tenderness to pressure over the xiphoid process or lower chest wall.  Rectal; exam not done, Extremities; no clubbing cyanosis or edema skin warm and dry, Neuropsych; alert and oriented, grossly nonfocal mood and affect appropriate       Assessment  & Plan:   #12 43 year old African-American male with persistent complaints of lower chest and upper abdominal pain, nausea and weight loss of about 45 pounds over the past several months.  Etiology of symptoms is not clear, no good response to twice daily PPI. Upper abdominal ultrasound December 2019 normal and CT of the abdomen only with IV and oral contrast last week read as negative other than small hiatal hernia. Rule out occult esophageal or gastric lesion, rule out peptic ulcer disease.  #2 morbid obesity  #3.  GERD #4.  Sleep apnea #5.  Anxiety #6.  Positive fecal lactoferrin and mildly elevated CRP   Plan;For now we will  continue Omeprazole 40 mg p.o. twice daily refill sent. Patient will be scheduled for upper endoscopy with Dr. Meridee Score.  Procedure was discussed in detail with the patient including indications risks and benefits and he is agreeable to proceed. Further plans pending findings at EGD.  Amy S Esterwood PA-C 11/28/2018   Cc: Baldo Daub, MD

## 2018-11-28 NOTE — Patient Instructions (Addendum)
To help prevent the possible spread of infection to our patients, communities, and staff; we will be implementing the following measures:  Please only allow one visitor/family member to accompany you to any upcoming appointments with [facility]. If you have any concerns about this please contact our office to discuss prior to the appointment.   If you are age 41 or older, your body mass index should be between 23-30. Your Body mass index is 46.52 kg/m. If this is out of the aforementioned range listed, please consider follow up with your Primary Care Provider.  If you are age 21 or younger, your body mass index should be between 19-25. Your Body mass index is 46.52 kg/m. If this is out of the aformentioned range listed, please consider follow up with your Primary Care Provider.   You have been scheduled for an endoscopy. Please follow written instructions given to you at your visit today. If you use inhalers (even only as needed), please bring them with you on the day of your procedure. Your physician has requested that you go to www.startemmi.com and enter the access code given to you at your visit today. This web site gives a general overview about your procedure. However, you should still follow specific instructions given to you by our office regarding your preparation for the procedure.  We have sent the following medications to your pharmacy for you to pick up at your convenience: Omeprazole 40mg : Take twice a day   Thank you for entrusting me with your care and for choosing [facility], [place], [place]

## 2018-11-29 NOTE — Progress Notes (Signed)
Agree with plan and assessment per PA Esterwood. Thank you for seeing him. As he is still having symptoms and is still having weight loss reasonable to proceed with diagnostic evaluation. As his stool cultures were negative as well it may not be unreasonable to consider both an upper and lower endoscopic evaluation. If the patient is reasonable and okay with performing a colonoscopy preparation then we should consider doing both an upper and lower endoscopic diagnostic evaluation. If able to reach out and see if he wants to do that so that we may just have 1 anesthesia event that would be reasonable if he wants to just proceed with the upper then that is fine as well.

## 2018-12-03 ENCOUNTER — Telehealth: Payer: Self-pay

## 2018-12-03 NOTE — Telephone Encounter (Signed)
Covid-19 travel screening questions  Have you traveled in the last 14 days? No. If yes where?  Do you now or have you had a fever in the last 14 days? No.  Do you have any respiratory symptoms of shortness of breath or cough now or in the last 14 days? No.  Do you have a medical history of Congestive Heart Failure?  Do you have a medical history of lung disease?  Do you have any family members or close contacts with diagnosed or suspected Covid-19? No.     Patient is aware and understands of our new policy of no care partners in the lobby. He had no further questions.

## 2018-12-04 ENCOUNTER — Encounter: Payer: Self-pay | Admitting: Gastroenterology

## 2018-12-04 ENCOUNTER — Other Ambulatory Visit: Payer: Self-pay

## 2018-12-04 ENCOUNTER — Ambulatory Visit (AMBULATORY_SURGERY_CENTER): Payer: Medicaid Other | Admitting: Gastroenterology

## 2018-12-04 VITALS — BP 115/65 | HR 58 | Temp 98.5°F | Resp 15 | Ht 72.0 in | Wt 343.0 lb

## 2018-12-04 DIAGNOSIS — K317 Polyp of stomach and duodenum: Secondary | ICD-10-CM

## 2018-12-04 DIAGNOSIS — K2980 Duodenitis without bleeding: Secondary | ICD-10-CM

## 2018-12-04 DIAGNOSIS — K3189 Other diseases of stomach and duodenum: Secondary | ICD-10-CM

## 2018-12-04 DIAGNOSIS — R1013 Epigastric pain: Secondary | ICD-10-CM

## 2018-12-04 DIAGNOSIS — K200 Eosinophilic esophagitis: Secondary | ICD-10-CM

## 2018-12-04 MED ORDER — SODIUM CHLORIDE 0.9 % IV SOLN: 500.0000 mL | Freq: Once | INTRAVENOUS | Status: DC

## 2018-12-04 NOTE — Progress Notes (Signed)
Pt's states no medical or surgical changes since previsit or office visit. Patient states wears a c-pap.

## 2018-12-04 NOTE — Patient Instructions (Signed)
Information on gastritis given to you today.    Await pathology results.  YOU HAD AN ENDOSCOPIC PROCEDURE TODAY AT THE  ENDOSCOPY CENTER:   Refer to the procedure report that was given to you for any specific questions about what was found during the examination.  If the procedure report does not answer your questions, please call your gastroenterologist to clarify.  If you requested that your care partner not be given the details of your procedure findings, then the procedure report has been included in a sealed envelope for you to review at your convenience later.  YOU SHOULD EXPECT: Some feelings of bloating in the abdomen. Passage of more gas than usual.  Walking can help get rid of the air that was put into your GI tract during the procedure and reduce the bloating. If you had a lower endoscopy (such as a colonoscopy or flexible sigmoidoscopy) you may notice spotting of blood in your stool or on the toilet paper. If you underwent a bowel prep for your procedure, you may not have a normal bowel movement for a few days.  Please Note:  You might notice some irritation and congestion in your nose or some drainage.  This is from the oxygen used during your procedure.  There is no need for concern and it should clear up in a day or so.  SYMPTOMS TO REPORT IMMEDIATELY:   Following upper endoscopy (EGD)  Vomiting of blood or coffee ground material  New chest pain or pain under the shoulder blades  Painful or persistently difficult swallowing  New shortness of breath  Fever of 100F or higher  Black, tarry-looking stools  For urgent or emergent issues, a gastroenterologist can be reached at any hour by calling [phone].   DIET:  We do recommend a small meal at first, but then you may proceed to your regular diet.  Drink plenty of fluids but you should avoid alcoholic beverages for 24 hours.  ACTIVITY:  You should plan to take it easy for the rest of today and you should NOT DRIVE  or use heavy machinery until tomorrow (because of the sedation medicines used during the test).    FOLLOW UP: Our staff will call the number listed on your records the next business day following your procedure to check on you and address any questions or concerns that you may have regarding the information given to you following your procedure. If we do not reach you, we will leave a message.  However, if you are feeling well and you are not experiencing any problems, there is no need to return our call.  We will assume that you have returned to your regular daily activities without incident.  If any biopsies were taken you will be contacted by phone or by letter within the next 1-3 weeks.  Please call us at [phone] if you have not heard about the biopsies in 3 weeks.    SIGNATURES/CONFIDENTIALITY: You and/or your care partner have signed paperwork which will be entered into your electronic medical record.  These signatures attest to the fact that that the information above on your After Visit Summary has been reviewed and is understood.  Full responsibility of the confidentiality of this discharge information lies with you and/or your care-partner. 

## 2018-12-04 NOTE — Progress Notes (Signed)
Called to room to assist during endoscopic procedure.  Patient ID and intended procedure confirmed with present staff. Received instructions for my participation in the procedure from the performing physician.  

## 2018-12-04 NOTE — Op Note (Signed)
[facility] Patient Name: Jack Buckley Procedure Date: 12/04/2018 9:33 AM MRN: [id number] Endoscopist: Justice Britain , MD Age: 43 Referring MD:  Date of Birth: 1976-01-21 Gender: Male Account #: [id number] Procedure:                Upper GI endoscopy Indications:              Epigastric abdominal pain, Heartburn, Diarrhea Medicines:                Monitored Anesthesia Care Procedure:                Pre-Anesthesia Assessment:                           - Prior to the procedure, a History and Physical                            was performed, and patient medications and                            allergies were reviewed. The patient's tolerance of                            previous anesthesia was also reviewed. The risks                            and benefits of the procedure and the sedation                            options and risks were discussed with the patient.                            All questions were answered, and informed consent                            was obtained. Prior Anticoagulants: The patient has                            taken no previous anticoagulant or antiplatelet                            agents. ASA Grade Assessment: II - A patient with                            mild systemic disease. After reviewing the risks                            and benefits, the patient was deemed in                            satisfactory condition to undergo the procedure.                           After obtaining informed consent, the endoscope was  passed under direct vision. Throughout the                            procedure, the patient's blood pressure, pulse, and                            oxygen saturations were monitored continuously. The                            Model GIF-HQ190 [id number]) scope was introduced                            through the mouth, and advanced to the second part                            of  duodenum. The upper GI endoscopy was                            accomplished without difficulty. The patient                            tolerated the procedure. Scope In: Scope Out: Findings:                 No gross lesions were noted in the majority of the                            esophagus. Biopsies were taken with a cold forceps                            for histology from the proximal and middle                            esophagus for EoE. Biopsies were taken with a cold                            forceps for histology from the distal esophagus for                            EoE.                           Two tongues of salmon-colored mucosa were present                            from 41 to 42 cm. No other visible abnormalities                            were present. The maximum longitudinal extent of                            these esophageal mucosal changes was 1 cm in  length. Mucosa was biopsied with a cold forceps for                            histology in a targeted manner. One specimen bottle                            was sent to pathology.                           The Z-line was irregular and was found 42 cm from                            the incisors.                           Multiple dispersed, diminutive non-bleeding                            erosions were found in the gastric antrum. There                            were no stigmata of recent bleeding.                           No other gross lesions were noted in the entire                            examined stomach. Biopsies were taken with a cold                            forceps for histology and Helicobacter pylori                            testing.                           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                            Biopsies were taken with a cold forceps for                             histology to rule out adenoma.                           No other gross lesions were noted in the D1/D2                            angle-sweep and in the second portion of the  duodenum. Biopsies were taken with a cold forceps                            for histology to rule out Celiac disease and                            enteropathy. Complications:            No immediate complications. Estimated Blood Loss:     Estimated blood loss was minimal. Impression:               - No gross lesions in majority esophagus. Biopsied                            for EoE.                           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 Biopsied to rule out Barrett's.                           - Z-line irregular, 42 cm from the incisors.                           - Non-bleeding erosive gastropathy. Otherwise no                            gross lesions in the stomach. Biopsied for HP.                           - A few duodenal polyps. Biopsied to rule out                            adenoma.                           - No gross lesions in the D1/D2 angle and in the                            second portion of the duodenum. Biopsied for Celiac                            rule out. Recommendation:           - The patient will be observed post-procedure,                            until all discharge criteria are met.                           - Discharge patient to home.                           - Patient has a contact number available for  emergencies. The signs and symptoms of potential                            delayed complications were discussed with the                            patient. Return to normal activities tomorrow.                            Written discharge instructions were provided to the                            patient.                           - Await  pathology results.                           - Observe patient's clinical course.                           - Await pathology results.                           - Based on results will consider next steps in                            follow up. Will consider possible CT-Abdomen for                            further workup of symptoms if they persist. At some                            point in future will need to also consider a                            diagnostic colonoscopy for his diarrheal symptoms                            if they persist. Justice Britain, MD 12/04/2018 10:32:04 AM

## 2018-12-04 NOTE — Progress Notes (Signed)
Report given to PACU, vss 

## 2018-12-05 ENCOUNTER — Telehealth: Payer: Self-pay

## 2018-12-05 NOTE — Telephone Encounter (Signed)
Called and spoke with the patient this morning. Sounds like it is most likely a little lesion as a result of the bite block. He is not having any blood or pus from the region. Just some discomfort. I think it is reasonable for the patient to go ahead and use a antimicrobial mouthwash 3-4 times per day.  He could also consider using water with salt and swish and spit that 3-4 times per day. I would like for our team to reach out to him tomorrow to check in on him and see how he was doing. Patient appreciative for call back. Thank you. GM

## 2018-12-05 NOTE — Telephone Encounter (Signed)
  Follow up Call-  Call back number 12/04/2018  Post procedure Call Back phone  # [phone]  Permission to leave phone message Yes  Some recent data might be hidden     No ID on voicemail; no message left

## 2018-12-05 NOTE — Telephone Encounter (Signed)
Pt called in to report new lesion on tongue noted this am.  He felt it was due to bite block and wanted a call-back from anesthesia.  Josh Monday CRNA on duty today and he advised forwarding note to Dr Meridee Score.

## 2018-12-06 NOTE — Telephone Encounter (Signed)
Thanks for update. GM 

## 2018-12-06 NOTE — Telephone Encounter (Signed)
Patient reports his symptoms are improved./  Sore is healing from bite block

## 2018-12-10 ENCOUNTER — Encounter: Payer: Self-pay | Admitting: Gastroenterology

## 2018-12-11 ENCOUNTER — Ambulatory Visit: Payer: Self-pay | Admitting: Gastroenterology

## 2018-12-13 ENCOUNTER — Telehealth: Payer: Self-pay

## 2018-12-13 MED ORDER — PANTOPRAZOLE SODIUM 40 MG PO TBEC: 40.0000 mg | DELAYED_RELEASE_TABLET | Freq: Two times a day (BID) | ORAL | 2 refills | Status: DC

## 2018-12-13 NOTE — Telephone Encounter (Signed)
Pt informed of results from EGD. Pt states that he understands. We will change patients PPI from Omeprazole to Protonix 40mg  twice daily. RX sent to [facility] in [place] at patients request.

## 2019-01-02 ENCOUNTER — Ambulatory Visit: Payer: Self-pay | Admitting: Pulmonary Disease

## 2019-01-07 ENCOUNTER — Telehealth: Payer: Self-pay

## 2019-01-07 MED ORDER — PANTOPRAZOLE SODIUM 40 MG PO TBEC: 40.0000 mg | DELAYED_RELEASE_TABLET | Freq: Two times a day (BID) | ORAL | 2 refills | Status: DC

## 2019-01-07 NOTE — Telephone Encounter (Signed)
-----   Message from Lemar Lofty., MD sent at 01/07/2019  1:29 PM EDT ----- Regarding: Patient in for follow up tomorrow Jack Buckley or Covering RN, This is a patient who had a CT scan done at [facility] within the last couple of months. I need to get the results of the CT scan. I also then need to have the CD sent to [place] so that we can have our Radiologists review the imaging. He is scheduled for tomorrow clinic visit. Thank you. GM

## 2019-01-08 ENCOUNTER — Other Ambulatory Visit: Payer: Self-pay

## 2019-01-08 ENCOUNTER — Ambulatory Visit: Payer: Self-pay | Admitting: Gastroenterology

## 2019-01-08 DIAGNOSIS — R634 Abnormal weight loss: Secondary | ICD-10-CM

## 2019-01-08 DIAGNOSIS — R194 Change in bowel habit: Secondary | ICD-10-CM

## 2019-01-08 DIAGNOSIS — R0789 Other chest pain: Secondary | ICD-10-CM

## 2019-01-08 MED ORDER — PANTOPRAZOLE SODIUM 40 MG PO TBEC: 40.0000 mg | DELAYED_RELEASE_TABLET | Freq: Two times a day (BID) | ORAL | 2 refills | Status: DC

## 2019-01-08 NOTE — Patient Instructions (Signed)
If you are age 41 or older, your body mass index should be between 23-30. Your There is no height or weight on file to calculate BMI. If this is out of the aforementioned range listed, please consider follow up with your Primary Care Provider.  If you are age 19 or younger, your body mass index should be between 19-25. Your There is no height or weight on file to calculate BMI. If this is out of the aformentioned range listed, please consider follow up with your Primary Care Provider.    We have sent the following medications to your pharmacy for you to pick up at your convenience: Protonix sent to [facility] once daily.   Thank you for choosing me and [facility].  Dr. Meridee Score

## 2019-01-08 NOTE — Progress Notes (Signed)
GASTROENTEROLOGY OUTPATIENT CLINIC VISIT   Primary Care Provider Baldo Daub, MD [address] [place] [address] [phone]  Patient Profile: Jack Buckley. is a 43 y.o. male with a pmh significant for obesity, sleep apnea, PUD. The patient presents to the [facility] for an evaluation and management of problem(s) noted below:  Problem List 1. Atypical chest pain   2. Non-cardiac chest pain   3. Change in bowel habits   4. Unintentional weight loss     History of Present Illness: Please see initial consultation note as well as follow-up note per PA [place] for full details of HPI.    I connected with  Jack Buckley.. I verified that I was speaking with the correct person using two identifiers. Due to the COVID-19 Pandemic, this service was provided via telemedicine using Audio/Visual Media. The patient was located at in his car out of his home The provider was located in the office. The patient did consent to this visit and is aware of charges through their insurance as well as the limitations of evaluation and management by telemedicine. Other persons participating in this telemedicine service were none.   Interval History Overall, the patient has not had to significant change in his symptoms.  He has not initiated the new PPI due to issues from a financial perspective.  He is actually on his way to consider picking it up but the medication is relatively expensive.  Atypical noncardiac chest discomfort still persists.  Weight is stable.  Bowel habits are fluctuating from loose to hard similar to prior but less looseness.  No nocturnal symptoms.  We are still trying to obtain the imaging study from [facility] medical in regards to his CT scan that he had but the results are noted as below  GI Review of Systems Positive as above including infrequent dysphagia to solid foods Negative for odynophagia, melena,  hematochezia  Review of Systems General: Denies fevers/chills Cardiovascular: Denies current chest pain/palpitations Pulmonary: Denies shortness of breath/nocturnal cough Gastroenterological: See HPI Genitourinary: Denies darkened urine Hematological: Denies easy bruising Dermatological: Denies jaundice Psychological: Mood is stable   Medications Current Outpatient Medications  Medication Sig Dispense Refill   Magnesium 400 MG CAPS Take 1 capsule by mouth daily.     pantoprazole (PROTONIX) 40 MG tablet Take 1 tablet (40 mg total) by mouth 2 (two) times daily. 60 tablet 2   potassium chloride SA (KLOR-CON M20) 20 MEQ tablet Take 1 tablet (20 mEq total) by mouth daily. 30 tablet 3   No current facility-administered medications for this visit.     Allergies No Known Allergies  Histories Past Medical History:  Diagnosis Date   Chest pain 08/03/2018   Shortness of breath 08/03/2018   Sleep apnea    Past Surgical History:  Procedure Laterality Date   LEFT HEART CATH AND CORONARY ANGIOGRAPHY N/A 08/13/2018   Procedure: LEFT HEART CATH AND CORONARY ANGIOGRAPHY;  Surgeon: Yvonne Kendall, MD;  Location: [facility] INVASIVE CV LAB;  Service: Cardiovascular;  Laterality: N/A;   Social History   Socioeconomic History   Marital status: Married    Spouse name: Not on file   Number of children: 1   Years of education: Not on file   Highest education level: Not on file  Occupational History   Occupation: Truck driver  Social Needs   Financial resource strain: Not on file   Food insecurity:    Worry: Not on file  Inability: Not on file   Transportation needs:    Medical: Not on file    Non-medical: Not on file  Tobacco Use   Smoking status: Never Smoker   Smokeless tobacco: Never Used  Substance and Sexual Activity   Alcohol use: Yes    Comment: occ   Drug use: Never   Sexual activity: Not on file  Lifestyle   Physical activity:    Days per week: Not on  file    Minutes per session: Not on file   Stress: Not on file  Relationships   Social connections:    Talks on phone: Not on file    Gets together: Not on file    Attends religious service: Not on file    Active member of club or organization: Not on file    Attends meetings of clubs or organizations: Not on file    Relationship status: Not on file   Intimate partner violence:    Fear of current or ex partner: Not on file    Emotionally abused: Not on file    Physically abused: Not on file    Forced sexual activity: Not on file  Other Topics Concern   Not on file  Social History Narrative   Not on file   Family History  Problem Relation Age of Onset   Heart disease Mother    Hypertension Mother    Hyperlipidemia Mother    Hyperlipidemia Father    Diabetes Maternal Aunt    Colon cancer Neg Hx    Esophageal cancer Neg Hx    Inflammatory bowel disease Neg Hx    Liver disease Neg Hx    Pancreatic cancer Neg Hx    Rectal cancer Neg Hx    Stomach cancer Neg Hx    I have reviewed his medical, social, and family history in detail and updated the electronic medical record as necessary.    PHYSICAL EXAMINATION  Telehealth visit   REVIEW OF DATA  I reviewed the following data at the time of this encounter:  GI Procedures and Studies  March 2020 EGD - No gross lesions in majority esophagus. Biopsied for EoE. - Salmon-colored mucosa suggestive of short-segment Barrett's esophagus. Biopsied to rule out Barrett's. - Z-line irregular, 42 cm from the incisors. - Non-bleeding erosive gastropathy. Otherwise no gross lesions in the stomach. Biopsied for HP. - A few duodenal polyps. Biopsied to rule out adenoma. - No gross lesions in the D1/D2 angle and in the second portion of the duodenum. Biopsied for Celiac rule out. Pathology  Diagnosis 1. Surgical [P], duodenal - PEPTIC DUODENITIS - NO DYSPLASIA OR MALIGNANCY IDENTIFIED 2. Surgical [P], duodenal  polyps (multiple) - PEPTIC DUODENITIS - GASTRIC HETEROTOPIA - NO DYSPLASIA OR MALIGNANCY IDENTIFIED 3. Surgical [P], gastric random BXs - BENIGN GASTRIC MUCOSA - NO H. PYLORI, INTESTINAL METAPLASIA OR MALIGNANCY IDENTIFIED 4. Surgical [P], distal esophagus - BENIGN SQUAMOUS MUCOSA - NO INCREASED INTRAEPITHELIAL EOSINOPHILS 5. Surgical [P], distal esophagus - BENIGN SQUAMOUS MUCOSA - NO INCREASED INTRAEPITHELIAL EOSINOPHILS 6. Surgical [P], proximal mid esophagus - REACTIVE SQUAMOUS MUCOSA WITH INCREASED INTRAEPITHELIAL LYMPHOCYTES AND RARE EOSINOPHILS - SEE COMMENT   Laboratory Studies  Reviewed in epic  Imaging Studies  March 2020 outside CT abdomen pelvis    ASSESSMENT  Jack Buckley is a 43 y.o. male with a pmh significant for obesity, sleep apnea, PUD.  The patient is seen today for evaluation and management of:  1. Atypical chest pain   2. Non-cardiac chest pain  3. Change in bowel habits   4. Unintentional weight loss    The patient's overall clinical status has not grossly changed from prior.  I do think it is worthwhile to do a one-time switch of PPI were in the process of trying to do that with Protonix.  We discovered that financially it is quite expensive because of his insurance for the pantoprazole.  We looked at good [facility] website today and does look like he may be able to have a significant decrease in the cost overall for twice daily dosing of Protonix.  We will see if we can send a prescription to the pharmacy for him to be able to try good Rx and see if that is worthwhile for him to work with for a few weeks.  Still having changes in his bowel habits but mildly improved from prior.  We are holding on diagnostic colonoscopy for now.  Atypical chest discomfort will require further work-up and most likely will require a manometry once the COVID-19 pandemic has improved if this persists.  We are working on obtaining the cross-sectional CT abdomen pelvis for an overread by  radiologists as well.  Weight is stable currently  All patient questions were answered, to the best of my ability, and the patient agrees to the aforementioned plan of action with follow-up as indicated.   PLAN  Protonix 40 mg twice daily Lifestyle modifications as previously noted for GERD Try to get Protonix via good [facility] Awaiting review of CT abdomen/pelvis with [facility] radiology (CD pending mail) May consider solid food gastric emptying study Likely will need manometry for further evaluation of atypical chest pain We will be considering a diagnostic colonoscopy based on patient's symptoms as well at some point in the near future   No orders of the defined types were placed in this encounter.   New Prescriptions   No medications on file   Modified Medications   Modified Medication Previous Medication   PANTOPRAZOLE (PROTONIX) 40 MG TABLET pantoprazole (PROTONIX) 40 MG tablet      Take 1 tablet (40 mg total) by mouth 2 (two) times daily.    Take 1 tablet (40 mg total) by mouth 2 (two) times daily.    Planned Follow Up: No follow-ups on file.   Corliss Parish, MD [facility] Office # [phone]

## 2019-01-12 DIAGNOSIS — R0789 Other chest pain: Secondary | ICD-10-CM | POA: Insufficient documentation

## 2019-01-23 ENCOUNTER — Telehealth: Payer: Self-pay | Admitting: Gastroenterology

## 2019-01-23 NOTE — Telephone Encounter (Signed)
Spoke with the pt and he states he would need to call me back he was in a grocery store and could not talk

## 2019-01-23 NOTE — Telephone Encounter (Signed)
Pt calling stating that his GERD has been acting up, would like some advise. pls call him.

## 2019-01-24 ENCOUNTER — Other Ambulatory Visit: Payer: Self-pay | Admitting: Cardiology

## 2019-01-24 NOTE — Telephone Encounter (Signed)
The pt was advised to continue protonix and GERD modifications as advised at last office visit.  He states his muscles are not as strong as they were and he feels like he might have "sepsis".  We discussed what sepsis is and that it is very unlikely that he is septic without fever or other symptoms. Encouraged pt to contact PCP with complaints. The pt has been advised of the information and verbalized understanding.

## 2019-01-24 NOTE — Telephone Encounter (Signed)
.    Refill request received for Lasix. Lasix was discontinued on 08/08/2018 by Dr. Dulce Sellar         Called patient to inform of this, no answer, voicemail  full, unable to leave message

## 2019-01-24 NOTE — Telephone Encounter (Signed)
°*  STAT* If patient is at the pharmacy, call can be transferred to refill team.   1. Which medications need to be refilled? (please list name of each medication and dose if known) furosemide (LASIX) 40 MG tablet   2. Which pharmacy/location (including street and city if local pharmacy) is medication to be sent to? [facility] [place] 3. Do they need a 30 day or 90 day supply? 90 day

## 2019-01-25 ENCOUNTER — Other Ambulatory Visit: Payer: Self-pay | Admitting: Cardiology

## 2019-01-28 ENCOUNTER — Telehealth: Payer: Self-pay | Admitting: Gastroenterology

## 2019-01-28 ENCOUNTER — Other Ambulatory Visit: Payer: Self-pay | Admitting: Cardiology

## 2019-01-28 ENCOUNTER — Telehealth: Payer: Self-pay | Admitting: Cardiology

## 2019-01-28 NOTE — Telephone Encounter (Signed)
The pt called with questions regarding a "muscle tightness" in his lower abd when trying to lift a case of water.  He was advised to call PCP with questions related to muscle soreness.  The pt has been advised of the information and verbalized understanding.

## 2019-01-28 NOTE — Telephone Encounter (Signed)
Patient had taken 40mg  lasix in 2019 and is having same issues again so needs a refill.

## 2019-01-28 NOTE — Telephone Encounter (Signed)
Patient states he's having problems with shortness of breath and continued swelling in left ankle and needs a lasix refill.     Lasix was ordered 08/03/18 and dc'd 09/07/18. He states he never took the lasix daily as ordered, he spread it out to conserve money and just took the last dose a few days ago.     Patient reports shortness of breath and chest pressure sporadically, sometimes at rest and sometimes with activity and the pressure is substernal. He was taking protonix for reflux but stopped it.     He has lost over 50 # with current weight #330. He is walking and eating less. He does not check BP or HR however.   pls advise, tx

## 2019-01-28 NOTE — Telephone Encounter (Signed)
°*  STAT* If patient is at the pharmacy, call can be transferred to refill team.   1. Which medications need to be refilled? (please list name of each medication and dose if known) lasix  2. Which pharmacy/location (including street and city if local pharmacy) [facility] [address] [place]  3. Do they need a 30 day or 90 day supply? 90

## 2019-01-28 NOTE — Telephone Encounter (Signed)
Patient called said that he is feeling tightness in his lower abd and would like to know what could be cause.

## 2019-01-28 NOTE — Telephone Encounter (Signed)
Phoned patient, informed that extensive cardiac work-up shows he is not in CHF and that Dr. Dulce Sellar feels this is not cardiac in origin and would like him to follow-up with Pulmonology. He verbalizes understanding and agrees to do so.

## 2019-01-28 NOTE — Telephone Encounter (Signed)
He has had a very extensive evaluation including heart catheterization echocardiogram and proBNP level and does not have congestive heart failure.  He has been seen by the pulmonary doctors for sleep apnea and obesity hypoventilation syndrome I do not want to put him on further diuretics and advised him to follow-up with pulmonary physicians.

## 2019-02-05 ENCOUNTER — Telehealth: Payer: Self-pay | Admitting: Cardiology

## 2019-02-05 NOTE — Telephone Encounter (Signed)
°*  STAT* If patient is at the pharmacy, call can be transferred to refill team. ° ° °1. Which medications need to be refilled? (please list name of each medication and dose if known) furosemide (LASIX) 40 MG tablet  ° °2. Which pharmacy/location (including street and city if local pharmacy) is medication to be sent to?  °[id number] [place], [place] - [address] [phone] (Phone) °[phone] (Fax)  ° ° °3. Do they need a 30 day or 90 day supply? 90 day °

## 2019-02-05 NOTE — Telephone Encounter (Signed)
Patient requesting lasix refill. Informed medication was discontinued in 07/2018. He states he's been on it since then and thinks he needs to be on it. He continues to take KCL. He's like to know why it was discontinued.   pls advise, tx

## 2019-02-05 NOTE — Telephone Encounter (Signed)
He doesn't have heart failure

## 2019-02-06 ENCOUNTER — Other Ambulatory Visit: Payer: Self-pay

## 2019-02-06 NOTE — Telephone Encounter (Signed)
Called and informed patient that per Dr. Dulce Sellar, he does not have heart failure and should stop taking both lasix and KCL. He verbalizes understanding and has no further questions or concerns.

## 2019-02-06 NOTE — Telephone Encounter (Signed)
The potassium had an end date of 11/28/2018 so he should not still be taking it. It was determined he did not have heart failure on previous testing. Lasix discontinued, potassium discontinued.

## 2019-02-06 NOTE — Telephone Encounter (Signed)
Should pt still be taking potassium?   pls advise, tx

## 2019-03-02 ENCOUNTER — Emergency Department (HOSPITAL_COMMUNITY)
Admission: EM | Admit: 2019-03-02 | Discharge: 2019-03-02 | Disposition: A | Discharge status: Home / Self Care | Payer: Medicaid Other | Attending: Emergency Medicine | Admitting: Emergency Medicine

## 2019-03-02 ENCOUNTER — Emergency Department (HOSPITAL_COMMUNITY): Payer: Medicaid Other

## 2019-03-02 ENCOUNTER — Other Ambulatory Visit: Payer: Self-pay

## 2019-03-02 ENCOUNTER — Encounter (HOSPITAL_COMMUNITY): Payer: Self-pay

## 2019-03-02 DIAGNOSIS — Z79899 Other long term (current) drug therapy: Secondary | ICD-10-CM | POA: Insufficient documentation

## 2019-03-02 DIAGNOSIS — Z20828 Contact with and (suspected) exposure to other viral communicable diseases: Secondary | ICD-10-CM | POA: Insufficient documentation

## 2019-03-02 DIAGNOSIS — R0789 Other chest pain: Secondary | ICD-10-CM

## 2019-03-02 LAB — HEPATIC FUNCTION PANEL
ALT: 16 U/L (ref 0–44)
AST: 15 U/L (ref 15–41)
Albumin: 3.8 g/dL (ref 3.5–5.0)
Alkaline Phosphatase: 58 U/L (ref 38–126)
Bilirubin, Direct: 0.1 mg/dL (ref 0.0–0.2)
Indirect Bilirubin: 0.3 mg/dL (ref 0.3–0.9)
Total Bilirubin: 0.4 mg/dL (ref 0.3–1.2)
Total Protein: 7.8 g/dL (ref 6.5–8.1)

## 2019-03-02 LAB — BASIC METABOLIC PANEL
Anion gap: 8 (ref 5–15)
BUN: 11 mg/dL (ref 6–20)
CO2: 26 mmol/L (ref 22–32)
Calcium: 9 mg/dL (ref 8.9–10.3)
Chloride: 105 mmol/L (ref 98–111)
Creatinine, Ser: 1.05 mg/dL (ref 0.61–1.24)
GFR calc Af Amer: >60 mL/min (ref >60)
GFR calc non Af Amer: >60 mL/min (ref >60)
Glucose, Bld: 87 mg/dL (ref 70–99)
Potassium: 4.1 mmol/L (ref 3.5–5.1)
Sodium: 139 mmol/L (ref 135–145)

## 2019-03-02 LAB — CBC
HCT: 41.4 % (ref 39.0–52.0)
Hemoglobin: 13.2 g/dL (ref 13.0–17.0)
MCH: 27.2 pg (ref 26.0–34.0)
MCHC: 31.9 g/dL (ref 30.0–36.0)
MCV: 85.4 fL (ref 80.0–100.0)
Platelets: 330 10*3/uL (ref 150–400)
RBC: 4.85 MIL/uL (ref 4.22–5.81)
RDW: 13.1 % (ref 11.5–15.5)
WBC: 6.4 10*3/uL (ref 4.0–10.5)
nRBC: 0 % (ref 0.0–0.2)

## 2019-03-02 LAB — MAGNESIUM: Magnesium: 2.1 mg/dL (ref 1.7–2.4)

## 2019-03-02 LAB — TROPONIN I: Troponin I: <0.03 ng/mL (ref <0.03)

## 2019-03-02 NOTE — ED Provider Notes (Signed)
I have personally seen and examined the patient. I have reviewed the documentation on PMH/FH/Soc Hx. I have discussed the plan of care with the resident and patient.  I have reviewed and agree with the resident's documentation. Please see associated encounter note.  Briefly, the patient is a 43 y.o. male here with intermittent chest pain for the last several months.  Reflux type symptoms.  Patient has had extensive cardiac work-up in the past.  Had a heart catheterization last year that showed normal coronaries.  EKG here shows sinus rhythm.  No ischemic changes.  Unchanged from prior.  Troponin normal.  Chest x-ray without any signs of pneumonia, pneumothorax.  Electrolytes unremarkable.  No significant anemia.  Patient states that he is mostly concerned for coronavirus and will check with outpatient swab.  Patient is PERC negative and doubt PE.  No concern for ACS.  Recommend continued use of Prilosec.  Given information about self isolation until his coronavirus test is back.  This chart was dictated using voice recognition software.  Despite best efforts to proofread,  errors can occur which can change the documentation meaning.   Jack Buckley. was evaluated in Emergency Department on 03/02/2019 for the symptoms described in the history of present illness. He was evaluated in the context of the global COVID-19 pandemic, which necessitated consideration that the patient might be at risk for infection with the SARS-CoV-2 virus that causes COVID-19. Institutional protocols and algorithms that pertain to the evaluation of patients at risk for COVID-19 are in a state of rapid change based on information released by regulatory bodies including the CDC and federal and state organizations. These policies and algorithms were followed during the patient's care in the ED.    EKG Interpretation  Date/Time:  Saturday March 02 2019 16:09:30 EDT Ventricular Rate:  74 PR Interval:    QRS Duration: 98 QT  Interval:  355 QTC Calculation: 394 R Axis:   59 Text Interpretation:  Sinus rhythm Low voltage, precordial leads RSR' in V1 or V2, right VCD or RVH Confirmed by Lennice Sites [phone]) on 03/02/2019 4:28:43 PM        Lennice Sites, DO 03/02/19 [id number]

## 2019-03-02 NOTE — ED Notes (Addendum)
Pt endorses left upper leg pain since last year as well. Worse with ambulation or sitting for a long period of time. Pt also states "It sounds comical but every time I bust a nut, it feels like my leg is strained"

## 2019-03-02 NOTE — ED Notes (Signed)
[  pt returned from xray  No change in his complaints

## 2019-03-02 NOTE — ED Notes (Signed)
The pt has numerus  Complaints from sept 2019 until now.  Chest high bp leg pain   Alert oriented no distress

## 2019-03-02 NOTE — ED Provider Notes (Signed)
[facility] Note   CSN: [id number] Arrival date & time: 03/02/19  1601     History   Chief Complaint Chief Complaint  Patient presents with  . Chest Pain    HPI Jack LeitzMichael L Justice Jr. is a 43 y.o. male.     The history is provided by the patient and medical records.  Chest Pain Pain location:  Substernal area Pain quality: aching, burning and tightness   Pain quality: no pressure, not sharp, not stabbing, not tearing and not throbbing   Pain radiates to:  Does not radiate Pain severity:  Mild Onset quality:  Gradual Duration:  9 months Timing:  Intermittent Progression:  Unchanged Chronicity:  Chronic Context: breathing, lifting, movement, raising an arm, at rest and stress   Context: not trauma   Relieved by:  Nothing Worsened by:  Nothing Ineffective treatments:  Certain positions, leaning forward and rest Associated symptoms: anxiety, fatigue, palpitations and shortness of breath   Associated symptoms: no abdominal pain, no back pain, no cough, no diaphoresis, no fever, no nausea, no near-syncope, no orthopnea, no PND, no syncope, no vomiting and no weakness   Risk factors: male sex and obesity   Risk factors: no coronary artery disease, no diabetes mellitus, no high cholesterol, no hypertension and no prior DVT/PE     Past Medical History:  Diagnosis Date  . Chest pain 08/03/2018  . Shortness of breath 08/03/2018  . Sleep apnea     Patient Active Problem List   Diagnosis Date Noted  . Atypical chest pain 01/12/2019  . Gastroesophageal reflux disease 10/01/2018  . Regurgitation of food 10/01/2018  . Change in bowel habits 10/01/2018  . Rectal bleeding 10/01/2018  . Unintentional weight loss 10/01/2018  . Exposure to carbon monoxide 10/01/2018  . Adult BMI 50.0-59.9 kg/sq m (HCC) 08/16/2018  . Non-cardiac chest pain 08/03/2018  . Shortness of breath 08/03/2018  . Sleep apnea 08/03/2018    Past Surgical  History:  Procedure Laterality Date  . LEFT HEART CATH AND CORONARY ANGIOGRAPHY N/A 08/13/2018   Procedure: LEFT HEART CATH AND CORONARY ANGIOGRAPHY;  Surgeon: Yvonne KendallEnd, Christopher, MD;  Location: [facility] INVASIVE CV LAB;  Service: Cardiovascular;  Laterality: N/A;        Home Medications    Prior to Admission medications   Medication Sig Start Date End Date Taking? Authorizing Provider  Magnesium 400 MG CAPS Take 1 capsule by mouth daily.    [provider]  pantoprazole (PROTONIX) 40 MG tablet Take 1 tablet (40 mg total) by mouth 2 (two) times daily. 01/08/19   Mansouraty, Netty StarringGabriel Jr., MD    Family History Family History  Problem Relation Age of Onset  . Heart disease Mother   . Hypertension Mother   . Hyperlipidemia Mother   . Hyperlipidemia Father   . Diabetes Maternal Aunt   . Colon cancer Neg Hx   . Esophageal cancer Neg Hx   . Inflammatory bowel disease Neg Hx   . Liver disease Neg Hx   . Pancreatic cancer Neg Hx   . Rectal cancer Neg Hx   . Stomach cancer Neg Hx     Social History Social History   Tobacco Use  . Smoking status: Never Smoker  . Smokeless tobacco: Never Used  Substance Use Topics  . Alcohol use: Yes    Comment: occ  . Drug use: Never     Allergies   Patient has no known allergies.   Review of Systems Review  of Systems  Constitutional: Positive for fatigue. Negative for diaphoresis and fever.  Respiratory: Positive for shortness of breath. Negative for cough.   Cardiovascular: Positive for chest pain and palpitations. Negative for orthopnea, syncope, PND and near-syncope.  Gastrointestinal: Negative for abdominal pain, nausea and vomiting.  Musculoskeletal: Negative for back pain.  Neurological: Negative for weakness.  All other systems reviewed and are negative.    Physical Exam Updated Vital Signs Pulse 71   Temp 99.3 F (37.4 C) (Oral)   Resp 16   SpO2 98%   Physical Exam Vitals signs and nursing note reviewed.   Constitutional:      Appearance: He is well-developed.  HENT:     Head: Normocephalic and atraumatic.  Eyes:     Conjunctiva/sclera: Conjunctivae normal.  Neck:     Musculoskeletal: Neck supple.     Vascular: No JVD.  Cardiovascular:     Rate and Rhythm: Normal rate.     Pulses:          Radial pulses are 2+ on the right side and 2+ on the left side.  Pulmonary:     Effort: Pulmonary effort is normal. No respiratory distress.     Breath sounds: No decreased breath sounds, wheezing or rhonchi.  Abdominal:     Palpations: Abdomen is soft.     Tenderness: There is no abdominal tenderness.  Musculoskeletal:     Right lower leg: No edema.     Left lower leg: No edema.  Skin:    General: Skin is warm and dry.     Capillary Refill: Capillary refill takes less than 2 seconds.  Neurological:     General: No focal deficit present.     Mental Status: He is alert and oriented to person, place, and time.      ED Treatments / Results  Labs (all labs ordered are listed, but only abnormal results are displayed) Labs Reviewed  BASIC METABOLIC PANEL  CBC  TROPONIN I    EKG    Radiology No results found.  Procedures Procedures (including critical care time)  Medications Ordered in ED Medications - No data to display   Initial Impression / Assessment and Plan / ED Course  I have reviewed the triage vital signs and the nursing notes.  Pertinent labs & imaging results that were available during my care of the patient were reviewed by me and considered in my medical decision making (see chart for details).        Medical Decision Making: Jack Orosz. is a 43 y.o. male who presented to the ED today with chest tightness.  Past medical history significant for GERD, sleep apnea Cardiac catheterization on 08/13/2018 that was negative, echo on 08/07/2018 that was grossly unremarkable, CTA on 09/07/2018 that was grossly unremarkable Reviewed and confirmed nursing  documentation for past medical history, family history, social history.  On my initial exam, the pt was calm, cooperative, conversant, follows commands appropriately, GCS 15, not tachycardic, not hypotensive, afebrile, no increased work of breathing or respiratory distress, no signs of impending respiratory failure.   The patient's risk factors for ACS were reviewed as well as the EKG. The CXR assists in r/o Pneumonia, Pneumothorax, Esophageal Tears.  No focal lung findings suggestive of pneumonia or pneumothorax. The patient does not appear to have a Pulmonary Embolism based on the Wells Score and PERC rule and there is no apparent asymmetric upper extremity or lower extremity edema/swelling suggestive of DVT.  Patient denies any fevers,  change in chest pain with leaning forward or lying down making pericarditis, myocarditis less likely. There does not appear to be an Aortic Dissection either based on physical exam, historically pain not abrupt in onset, tearing or ripping, pulses symmetric. MSK strain vs Costochondritis are also of consideration, (-)Rooster crowing sign, pain not reproducible with palpation.  EKG (my interpretation):      NS rhythm with a rate of  74.      QRS 98. QTc 394.      No acute ischemic ST-T segment changes.      No acute changes suggestive of hyperkalemia.      No WPW, LQTS, or Brugada's Syndrome. When compared to previous ECGs from 09/01/18 change no new concerning changes found.  Patient also endorsed palpitations once or twice in the last month, lasted less than 15 seconds, discussed that patient should discuss with cardiologist, PCP about potential loop recorder or Holter monitoring to assess for potential arrhythmia.   Troponin negative, sodium 139, CO2 26, creatinine 1.0, mag 2.1, white blood cell 6.4, hemoglobin 13.2  All radiology and laboratory studies reviewed independently and with my attending physician, agree with reading provided by radiologist unless  otherwise noted.  Upon reassessing patient, patient was calm, resting comfortably, no new complaints Based on the above findings, I believe patient is hemodynamically stable for discharge.  Patient educated about specific return precautions for given chief complaint and symptoms.  Patient educated about follow-up with PCP.  Patient expressed understanding of return precautions and need for follow-up.  Patient discharged. The above care was discussed with and agreed upon by my attending physician. Emergency Department Medication Summary:  Medications - No data to display      Final Clinical Impressions(s) / ED Diagnoses   Final diagnoses:  None    ED Discharge Orders    None       Erick Alleyasey, Othon Guardia, MD 03/02/19 1846    Virgina Norfolkuratolo, Adam, DO 03/02/19 2357

## 2019-03-02 NOTE — ED Notes (Signed)
Patient verbalizes understanding of discharge instructions. Opportunity for questioning and answers were provided. Armband removed by staff, pt discharged from ED.  

## 2019-03-02 NOTE — Discharge Instructions (Addendum)
Advance activities of daily living as tolerated. Stand up and ambulate with assistance until comfortable doing independently.  Advance diet as tolerated based on nausea, vomiting. Start with clear liquids and soft mechanical and advance.  Please return to ED for chest pain, shortness of breath, uncontrolled nausea, vomiting, any new concerning symptoms.  If prescribed any medications please take appropriate dose as prescribed and appropriate frequency has prescribed.  Please follow up with your PCP in 2-4 weeks or sooner if needed.  Please consider following up with cardiology, discussing intermittent palpitations, potential need for loop recorder, ZIO Patch or Holter monitoring to assess for arrhythmia

## 2019-03-02 NOTE — ED Triage Notes (Signed)
Pt endorses central chest tightness with radiation to the left arm with dizziness, shob, fatigue since September 2019 intermittently. Sees a cardiologist. States that it has been worse over the last 2 days.

## 2019-03-05 LAB — NOVEL CORONAVIRUS, NAA (HOSP ORDER, SEND-OUT TO REF LAB; TAT 18-24 HRS): SARS-CoV-2, NAA: NOT DETECTED

## 2019-03-27 DIAGNOSIS — R7303 Prediabetes: Secondary | ICD-10-CM | POA: Insufficient documentation

## 2019-03-27 DIAGNOSIS — Z9889 Other specified postprocedural states: Secondary | ICD-10-CM | POA: Insufficient documentation

## 2019-04-17 DIAGNOSIS — F419 Anxiety disorder, unspecified: Secondary | ICD-10-CM | POA: Insufficient documentation

## 2019-04-19 DIAGNOSIS — Z8249 Family history of ischemic heart disease and other diseases of the circulatory system: Secondary | ICD-10-CM | POA: Insufficient documentation

## 2019-09-11 ENCOUNTER — Ambulatory Visit (INDEPENDENT_AMBULATORY_CARE_PROVIDER_SITE_OTHER): Payer: Self-pay | Admitting: Allergy and Immunology

## 2019-09-11 ENCOUNTER — Encounter: Payer: Self-pay | Admitting: Allergy and Immunology

## 2019-09-11 ENCOUNTER — Other Ambulatory Visit: Payer: Self-pay

## 2019-09-11 VITALS — BP 138/82 | HR 90 | Temp 97.7°F | Resp 18 | Ht 72.5 in | Wt 348.6 lb

## 2019-09-11 DIAGNOSIS — J4530 Mild persistent asthma, uncomplicated: Secondary | ICD-10-CM

## 2019-09-11 NOTE — Patient Instructions (Addendum)
  1.  Samples of Pulmicort 90-2 inhalations twice a day  2.  If needed: Albuterol HFA-2 inhalations every 4-6 hours  3.  Return to clinic in 3 weeks or earlier if problem  4.  Slow, progressive exercise routine over the next 6 months  5.  Obtain flu and Covid vaccine

## 2019-09-11 NOTE — Progress Notes (Signed)
[place] - [place] - [place] - [place]   NEW PATIENT NOTE  Referring Provider: No ref. provider found Primary Provider: Olive Bass, MD Date of office visit: 09/11/2019    Subjective:   Chief Complaint:  Jack Buckley. (DOB: March 21, 1976) is a 43 y.o. male who presents to the clinic on 09/11/2019 with a chief complaint of Shortness of Breath .     HPI: Jack Buckley presents to this clinic in evaluation of breathing problems.  Jack Buckley states that sometime in 2019 Jack Buckley developed recurrent episodes of acute onset shortness of breath and chest tightness that Jack Buckley calls a "attack".  Jack Buckley has heard popping and wheezing in his chest during these episodes.  Most of these episodes appeared to be related to exertion initially but now Jack Buckley has some baseline chest tightness in addition to his exertional breathing problems.  Usually this will last several minutes and his recovery time is several minutes.  There is not really an obvious provoking factor giving rise to this issue.  Jack Buckley has had cardiology evaluation and apparently there does not appear to be any significant Cardiologic abnormality responsible for his symptoms.  Jack Buckley does have reflux which Jack Buckley believes is under very good control at this point if Jack Buckley is careful about what Jack Buckley eats and Jack Buckley uses a proton pump inhibitor.  Jack Buckley still occasionally has some issues but nothing like it was prior to addressing this problem.  Jack Buckley does not really have any other associated atopic disease other than the fact that Jack Buckley might of had "bronchitis" during childhood.  Jack Buckley gets a little nauseated when eating scrambled egg but Jack Buckley can eat boiled egg with no problem.  Jack Buckley does have an issue with anxiety.  Jack Buckley is using a CPAP machine for sleep apnea at this point.  Past Medical History:  Diagnosis Date  . Chest pain 08/03/2018  . Shortness of breath 08/03/2018  . Sleep apnea     Past Surgical History:  Procedure Laterality Date  . LEFT HEART CATH AND CORONARY  ANGIOGRAPHY N/A 08/13/2018   Procedure: LEFT HEART CATH AND CORONARY ANGIOGRAPHY;  Surgeon: Yvonne Kendall, MD;  Location: [facility] INVASIVE CV LAB;  Service: Cardiovascular;  Laterality: N/A;    Allergies as of 09/11/2019   No Known Allergies     Medication List    aspirin 81 MG EC tablet Take by mouth.   escitalopram 20 MG tablet Commonly known as: LEXAPRO Take by mouth.   PRILOSEC PO Take by mouth.       Review of systems negative except as noted in HPI / PMHx or noted below:  Review of Systems  Constitutional: Negative.   HENT: Negative.   Eyes: Negative.   Respiratory: Negative.   Cardiovascular: Negative.   Gastrointestinal: Negative.   Genitourinary: Negative.   Musculoskeletal: Negative.   Skin: Negative.   Neurological: Negative.   Endo/Heme/Allergies: Negative.   Psychiatric/Behavioral: Negative.     Family History  Problem Relation Age of Onset  . Heart disease Mother   . Hypertension Mother   . Hyperlipidemia Mother   . Asthma Mother   . Hyperlipidemia Father   . Diabetes Maternal Aunt   . Colon cancer Neg Hx   . Esophageal cancer Neg Hx   . Inflammatory bowel disease Neg Hx   . Liver disease Neg Hx   . Pancreatic cancer Neg Hx   . Rectal cancer Neg Hx   . Stomach cancer Neg Hx     Social History  Socioeconomic History  . Marital status: Married    Spouse name: Not on file  . Number of children: 1  . Years of education: Not on file  . Highest education level: Not on file  Occupational History  . Occupation: Truck Geophysicist/field seismologist  Tobacco Use  . Smoking status: Never Smoker  . Smokeless tobacco: Never Used  Substance and Sexual Activity  . Alcohol use: Yes    Comment: occ  . Drug use: Never  . Sexual activity: Not on file  Other Topics Concern  . Not on file  Social History Narrative  . Not on file   Social Determinants of Health   Financial Resource Strain:   . Difficulty of Paying Living Expenses: Not on file  Food Insecurity:   .  Worried About Charity fundraiser in the Last Year: Not on file  . Ran Out of Food in the Last Year: Not on file  Transportation Needs:   . Lack of Transportation (Medical): Not on file  . Lack of Transportation (Non-Medical): Not on file  Physical Activity:   . Days of Exercise per Week: Not on file  . Minutes of Exercise per Session: Not on file  Stress:   . Feeling of Stress : Not on file  Social Connections:   . Frequency of Communication with Friends and Family: Not on file  . Frequency of Social Gatherings with Friends and Family: Not on file  . Attends Religious Services: Not on file  . Active Member of Clubs or Organizations: Not on file  . Attends Archivist Meetings: Not on file  . Marital Status: Not on file  Intimate Partner Violence:   . Fear of Current or Ex-Partner: Not on file  . Emotionally Abused: Not on file  . Physically Abused: Not on file  . Sexually Abused: Not on file    Environmental and Social history  Lives in a apartment with a dry environment, no animals located inside the household, carpet in the bedroom, no plastic on the bed, no plastic on the pillow, and no smoking ongoing with inside the household.  Objective:   Vitals:   09/11/19 1400  BP: 138/82  Pulse: 90  Resp: 18  Temp: 97.7 F (36.5 C)  SpO2: 98%   Height: 6' 0.5" (184.2 cm) Weight: (!) 348 lb 9.6 oz (158.1 kg)  Physical Exam Constitutional:      Appearance: Jack Buckley is not diaphoretic.  HENT:     Head: Normocephalic. No right periorbital erythema or left periorbital erythema.     Right Ear: Tympanic membrane, ear canal and external ear normal.     Left Ear: Tympanic membrane, ear canal and external ear normal.     Nose: Nose normal. No mucosal edema or rhinorrhea.     Mouth/Throat:     Pharynx: Uvula midline. No oropharyngeal exudate.  Eyes:     General: Lids are normal.     Conjunctiva/sclera: Conjunctivae normal.     Pupils: Pupils are equal, round, and reactive to  light.  Neck:     Thyroid: No thyromegaly.     Trachea: Trachea normal. No tracheal tenderness or tracheal deviation.  Cardiovascular:     Rate and Rhythm: Normal rate and regular rhythm.     Heart sounds: Normal heart sounds, S1 normal and S2 normal. No murmur.  Pulmonary:     Effort: Pulmonary effort is normal. No respiratory distress.     Breath sounds: Normal breath sounds. No stridor. No wheezing  or rales.  Chest:     Chest wall: No tenderness.  Abdominal:     General: There is no distension.     Palpations: Abdomen is soft. There is no mass.     Tenderness: There is no abdominal tenderness. There is no guarding or rebound.  Musculoskeletal:        General: No tenderness.  Lymphadenopathy:     Head:     Right side of head: No tonsillar adenopathy.     Left side of head: No tonsillar adenopathy.     Cervical: No cervical adenopathy.  Skin:    Coloration: Skin is not pale.     Findings: No erythema or rash.     Nails: There is no clubbing.  Neurological:     Mental Status: Jack Buckley is alert.     Diagnostics: Allergy skin tests were performed.   Spirometry was performed and demonstrated an FEV1 of 4.67 @ 119 % of predicted. FEV1/FVC = 0.89.  Following the administration of nebulized albuterol his FEV1 did not change significantly.  Results of a chest x-ray obtained 02 March 2019 identified the following:  Lungs are adequately inflated and otherwise clear. Cardiomediastinal silhouette, bones and soft tissues are normal.  Results of a echocardiogram obtained 07 August 2018 identified the following:  - Left ventricle: The cavity size was normal. There was mild   concentric hypertrophy. Systolic function was normal. The   estimated ejection fraction was in the range of 60% to 65%. Wall   motion was normal; there were no regional wall motion   abnormalities. Left ventricular diastolic function parameters   were normal. - Aortic valve: Transvalvular velocity was within the  normal range.   There was no stenosis. There was no regurgitation. - Mitral valve: Transvalvular velocity was within the normal range.   There was no evidence for stenosis. There was no regurgitation. - Right ventricle: The cavity size was normal. Wall thickness was   normal. Systolic function was normal. - Atrial septum: No defect or patent foramen ovale was identified   by color flow Doppler. - Tricuspid valve: There was trivial regurgitation. - Pulmonary arteries: Systolic pressure was within the normal   range. PA peak pressure: 20 mm Hg (S).  Assessment and Plan:    1. Not well controlled mild persistent asthma     1.  Samples of Pulmicort 90-2 inhalations twice a day  2.  If needed: Albuterol HFA-2 inhalations every 4-6 hours  3.  Return to clinic in 3 weeks or earlier if problem  4.  Slow, progressive exercise routine over the next 6 months  5.  Obtain flu and Covid vaccine  It is not entirely clear why Casimiro NeedleMichael has respiratory tract symptoms that appear to be predominantly associated with exertion but also to some degree during rest.  I will empirically treat him with anti-inflammatory agents for his lower airway assuming there may be a component of inflammation contributing to this issue over the course of the next 3 weeks and regroup with him at that point in time to consider further evaluation and treatment based upon his response to this approach.  As well, Jack Buckley has some issues with cardiac deconditioning and obesity and I had a talk with him today about those issue as well.  Jessica PriestEric J. Tiye Huwe, MD Allergy / Immunology [facility] [place]

## 2019-09-12 ENCOUNTER — Encounter: Payer: Self-pay | Admitting: Allergy and Immunology

## 2019-09-12 ENCOUNTER — Other Ambulatory Visit: Payer: Self-pay | Admitting: *Deleted

## 2019-09-12 MED ORDER — ALBUTEROL SULFATE HFA 108 (90 BASE) MCG/ACT IN AERS: INHALATION_SPRAY | RESPIRATORY_TRACT | 1 refills | Status: DC

## 2019-09-16 ENCOUNTER — Other Ambulatory Visit: Payer: Self-pay | Admitting: Pulmonary Disease

## 2019-09-16 DIAGNOSIS — R0602 Shortness of breath: Secondary | ICD-10-CM

## 2019-09-19 ENCOUNTER — Encounter: Payer: Self-pay | Admitting: Allergy and Immunology

## 2020-03-23 ENCOUNTER — Other Ambulatory Visit: Payer: Self-pay

## 2020-03-23 ENCOUNTER — Ambulatory Visit (INDEPENDENT_AMBULATORY_CARE_PROVIDER_SITE_OTHER): Payer: No Typology Code available for payment source | Admitting: Allergy and Immunology

## 2020-03-23 VITALS — BP 120/74 | HR 72 | Resp 20

## 2020-03-23 DIAGNOSIS — J4540 Moderate persistent asthma, uncomplicated: Secondary | ICD-10-CM | POA: Diagnosis not present

## 2020-03-23 DIAGNOSIS — J3089 Other allergic rhinitis: Secondary | ICD-10-CM

## 2020-03-23 MED ORDER — AIRDUO DIGIHALER 113-14 MCG/ACT IN AEPB: 1.0000 | INHALATION_SPRAY | Freq: Two times a day (BID) | RESPIRATORY_TRACT | 0 refills | Status: DC

## 2020-03-23 NOTE — Patient Instructions (Addendum)
  1.  Samples of AirDuo 113 Digihaler -1 inhalation twice a day  2. Start Nasacort - 1 spray each nostril 1 time per day  3.  If needed:    A. Albuterol HFA-2 inhalations every 4-6 hours  B. Loratadine 10 mg - 1 tablet 1 time per day  4.  Return to clinic in 3 weeks or earlier if problem  5.  Slow, progressive exercise routine over the next 6 months  6.  Obtain fall flu vaccine and Covid vaccine

## 2020-03-23 NOTE — Progress Notes (Signed)
[place] - [place] - [place] - [place] - [place]   Follow-up Note  Referring Provider: Olive Bass, MD Primary Provider: Olive Bass, MD Date of Office Visit: 03/23/2020  Subjective:   Jack Buckley. (DOB: Jan 21, 1976) is a 44 y.o. male who [facility] in re-evaluation of the following:  HPI: Jack Buckley returns to this clinic in reevaluation of his breathing problems addressed during his initial evaluation of 11 September 2019.  We started him on an inhaled steroid and instructed him to return to this clinic in 3 weeks.  He only used the inhaled steroid for about 2 weeks or so and never returned for a follow-up visit.  He is about the same.  He is still very dyspneic when he exerts himself.  He gets winded very easily.  He has this chest pressure when he exerts himself.  As well, he has these episodes of "air hunger" and shortness of breath and occasionally they can be associated with some dizziness and lightheadedness and almost feels as though he can faint.  However, when he has these episodes he still works without any difficulty and just pushes through it.  He will use albuterol and this appears to work in about 15 minutes or so.  In addition he does note that he is gets nasal congestion usually affecting his alternate nasal airway from side to side throughout the day.  He does not appear to have any anosmia or ugly nasal discharge or any other evidence of sinusitis.  He continues to use CPAP for his sleep apnea although his machine is apparently 42 years old and he is attempting to acquire a new machine.  Allergies as of 03/23/2020   No Known Allergies     Medication List      albuterol 108 (90 Base) MCG/ACT inhaler Commonly known as: Proventil HFA Inhale two puffs every 4-6 hours if needed for cough or wheeze.   aspirin 81 MG EC tablet Take by mouth.   escitalopram 20 MG tablet Commonly known as: LEXAPRO Take by  mouth.   PRILOSEC PO Take by mouth.       Past Medical History:  Diagnosis Date  . Chest pain 08/03/2018  . Shortness of breath 08/03/2018  . Sleep apnea     Past Surgical History:  Procedure Laterality Date  . LEFT HEART CATH AND CORONARY ANGIOGRAPHY N/A 08/13/2018   Procedure: LEFT HEART CATH AND CORONARY ANGIOGRAPHY;  Surgeon: Yvonne Kendall, MD;  Location: [facility];  Service: Cardiovascular;  Laterality: N/A;    Review of systems negative except as noted in HPI / PMHx or noted below:  Review of Systems  Constitutional: Negative.   HENT: Negative.   Eyes: Negative.   Respiratory: Negative.   Cardiovascular: Negative.   Gastrointestinal: Negative.   Genitourinary: Negative.   Musculoskeletal: Negative.   Skin: Negative.   Neurological: Negative.   Endo/Heme/Allergies: Negative.   Psychiatric/Behavioral: Negative.      Objective:   Vitals:   03/23/20 1056  BP: 120/74  Pulse: 72  Resp: 20  SpO2: 97%          Physical Exam Constitutional:      Appearance: He is not diaphoretic.  HENT:     Head: Normocephalic.     Right Ear: Tympanic membrane, ear canal and external ear normal.     Left Ear: Tympanic membrane, ear canal and external ear normal.     Nose: Nose  normal. No mucosal edema or rhinorrhea.     Mouth/Throat:     Pharynx: Uvula midline. No oropharyngeal exudate.  Eyes:     Conjunctiva/sclera: Conjunctivae normal.  Neck:     Thyroid: No thyromegaly.     Trachea: Trachea normal. No tracheal tenderness or tracheal deviation.  Cardiovascular:     Rate and Rhythm: Normal rate and regular rhythm.     Heart sounds: Normal heart sounds, S1 normal and S2 normal. No murmur heard.   Pulmonary:     Effort: No respiratory distress.     Breath sounds: Normal breath sounds. No stridor. No wheezing or rales.  Lymphadenopathy:     Head:     Right side of head: No tonsillar adenopathy.     Left side of head: No tonsillar adenopathy.      Cervical: No cervical adenopathy.  Skin:    Findings: No erythema or rash.     Nails: There is no clubbing.  Neurological:     Mental Status: He is alert.     Diagnostics:    Spirometry was performed and demonstrated an FEV1 of 3.71 at 95 % of predicted.   Assessment and Plan:   1. Not well controlled moderate persistent asthma   2. Perennial allergic rhinitis     1.  Samples of AirDuo 113 Digihaler -1 inhalation twice a day  2. Start Nasacort - 1 spray each nostril 1 time per day  3.  If needed:    A. Albuterol HFA-2 inhalations every 4-6 hours  B. Loratadine 10 mg - 1 tablet 1 time per day  4.  Return to clinic in 3 weeks or earlier if problem  5.  Slow, progressive exercise routine over the next 6 months  6.  Obtain fall flu vaccine and Covid vaccine  We will now try Emil a long-acting bronchodilator along with a inhaled steroid for the next 3 to 4 weeks and see what type of response we receive with this type of treatment and as well also start him on a nasal steroid as he does appear to have some congestive issues with his nose.  Further evaluation and treatment will be based upon his response.  Laurette Schimke, MD Allergy / Immunology [facility]

## 2020-03-24 ENCOUNTER — Encounter: Payer: Self-pay | Admitting: Allergy and Immunology

## 2020-05-01 DIAGNOSIS — R000 Tachycardia, unspecified: Secondary | ICD-10-CM | POA: Insufficient documentation

## 2020-05-01 DIAGNOSIS — R600 Localized edema: Secondary | ICD-10-CM | POA: Insufficient documentation

## 2020-06-04 DIAGNOSIS — Z832 Family history of diseases of the blood and blood-forming organs and certain disorders involving the immune mechanism: Secondary | ICD-10-CM | POA: Insufficient documentation

## 2020-06-15 ENCOUNTER — Other Ambulatory Visit: Payer: Self-pay

## 2020-06-15 ENCOUNTER — Emergency Department (HOSPITAL_COMMUNITY)
Admission: EM | Admit: 2020-06-15 | Discharge: 2020-06-15 | Disposition: A | Discharge status: Home / Self Care | Payer: PRIVATE HEALTH INSURANCE | Attending: Emergency Medicine | Admitting: Emergency Medicine

## 2020-06-15 ENCOUNTER — Emergency Department (HOSPITAL_COMMUNITY): Payer: PRIVATE HEALTH INSURANCE

## 2020-06-15 DIAGNOSIS — Z5321 Procedure and treatment not carried out due to patient leaving prior to being seen by health care provider: Secondary | ICD-10-CM | POA: Insufficient documentation

## 2020-06-15 DIAGNOSIS — M25519 Pain in unspecified shoulder: Secondary | ICD-10-CM | POA: Diagnosis not present

## 2020-06-15 DIAGNOSIS — R079 Chest pain, unspecified: Secondary | ICD-10-CM | POA: Insufficient documentation

## 2020-06-15 LAB — CBC
HCT: 40.3 % (ref 39.0–52.0)
Hemoglobin: 12.9 g/dL — ABNORMAL LOW (ref 13.0–17.0)
MCH: 27.6 pg (ref 26.0–34.0)
MCHC: 32 g/dL (ref 30.0–36.0)
MCV: 86.1 fL (ref 80.0–100.0)
Platelets: 313 10*3/uL (ref 150–400)
RBC: 4.68 MIL/uL (ref 4.22–5.81)
RDW: 13.3 % (ref 11.5–15.5)
WBC: 5.4 10*3/uL (ref 4.0–10.5)
nRBC: 0 % (ref 0.0–0.2)

## 2020-06-15 LAB — BASIC METABOLIC PANEL
Anion gap: 7 (ref 5–15)
BUN: 11 mg/dL (ref 6–20)
CO2: 26 mmol/L (ref 22–32)
Calcium: 9.3 mg/dL (ref 8.9–10.3)
Chloride: 106 mmol/L (ref 98–111)
Creatinine, Ser: 1.05 mg/dL (ref 0.61–1.24)
GFR calc Af Amer: >60 mL/min (ref >60)
GFR calc non Af Amer: >60 mL/min (ref >60)
Glucose, Bld: 90 mg/dL (ref 70–99)
Potassium: 4.2 mmol/L (ref 3.5–5.1)
Sodium: 139 mmol/L (ref 135–145)

## 2020-06-15 LAB — TROPONIN I (HIGH SENSITIVITY): Troponin I (High Sensitivity): 4 ng/L (ref <18)

## 2020-06-15 NOTE — ED Triage Notes (Signed)
C/O chest pain that's been as issue since 2019; stated he also uses CPAP at night d/t shortness of breathe, patient stated has a shoulder pain now also that why he's here today.

## 2020-07-06 IMAGING — CT CT ANGIO CHEST
2 of 8 series · 19 of 36 positions shown · IV contrast (iopamidol)
Comparison: August 05, 2018

CLINICAL DATA: Shortness of Breath

EXAM:
CT ANGIOGRAPHY CHEST WITH CONTRAST
TECHNIQUE: Multidetector CT imaging of the chest was performed using the
standard protocol during bolus administration of intravenous
contrast. Multiplanar CT image reconstructions and MIPs were
obtained to evaluate the vascular anatomy.
CONTRAST:  78mL [id number] IOPAMIDOL ([id number]) INJECTION 76%

[Series 6: pe thins · axial · 0.78mm/px · z∈[-289,-26]mm · 18 of 391 slices shown]
[im 20/391  lung]
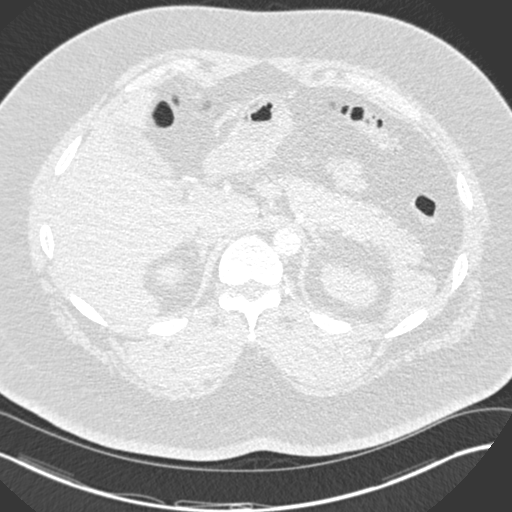
[im 40/391  mediastinal]
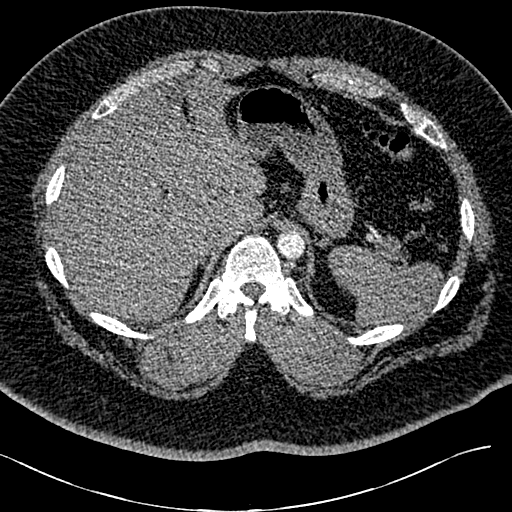
[im 59/391  lung]
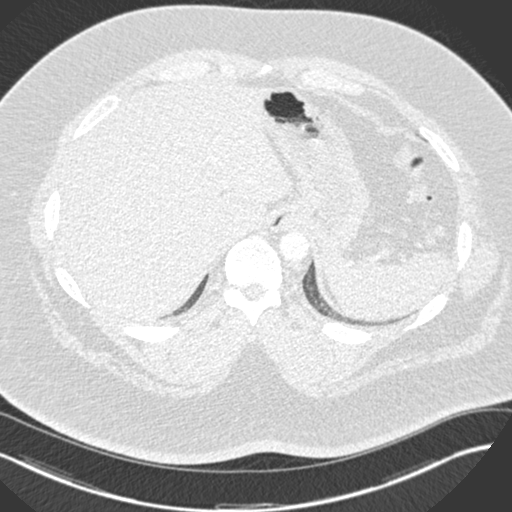
[im 79/391  mediastinal]
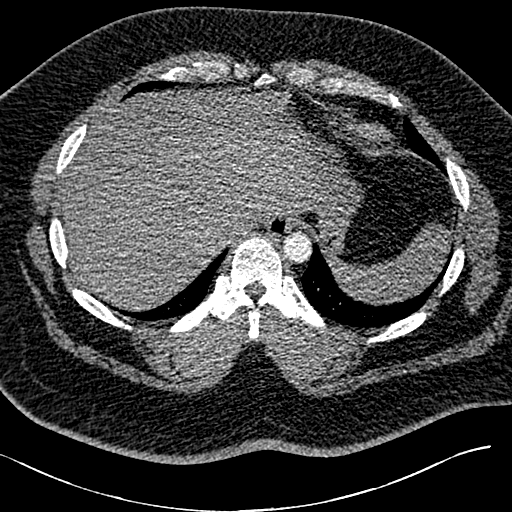
[im 98/391  lung]
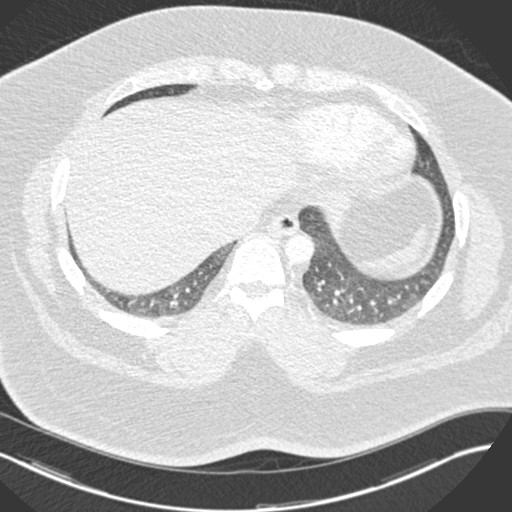
[im 118/391  mediastinal]
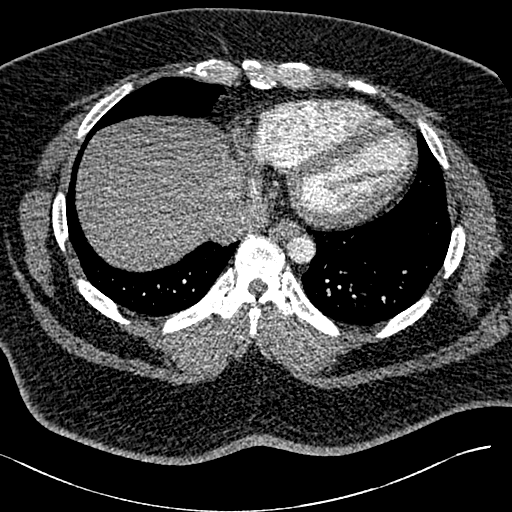
[im 137/391  lung]
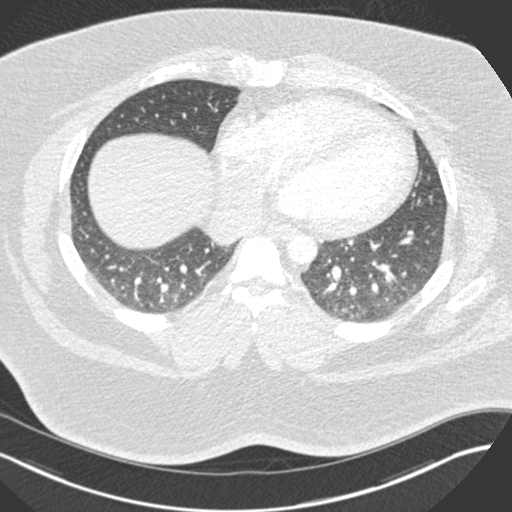
[im 157/391  mediastinal]
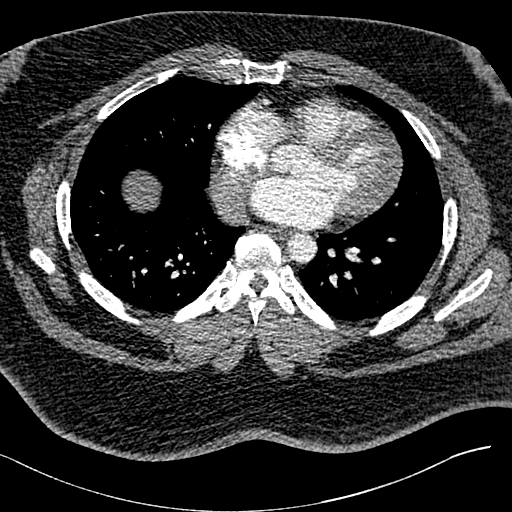
[im 176/391  lung]
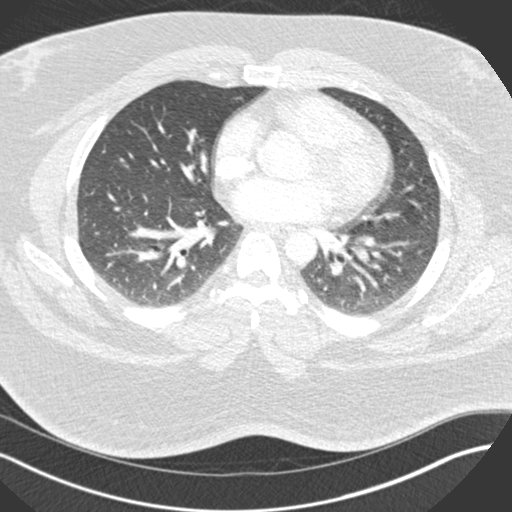
[im 215/391  mediastinal]
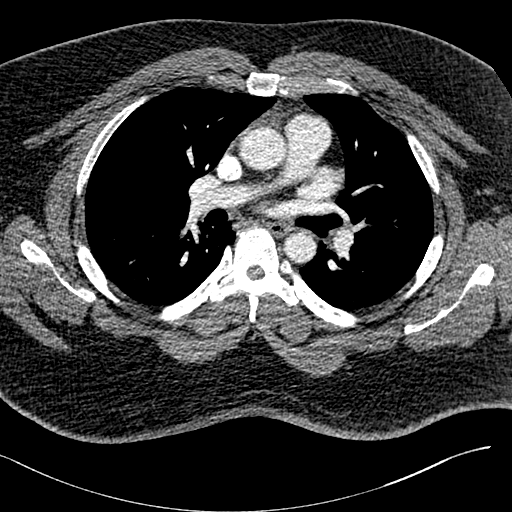
[im 235/391  lung]
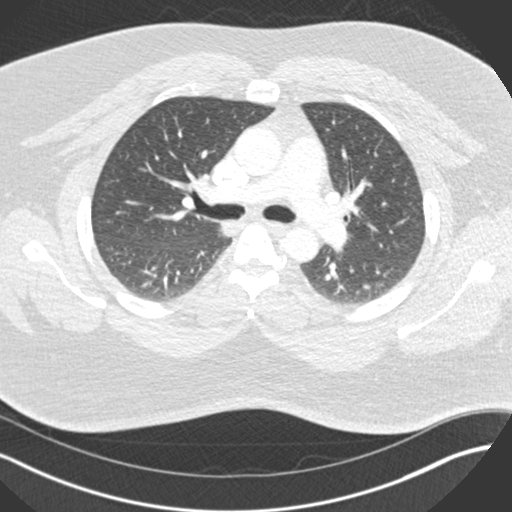
[im 254/391  mediastinal]
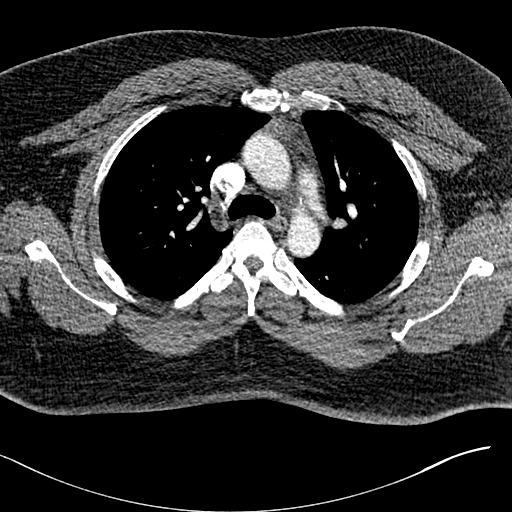
[im 274/391  lung]
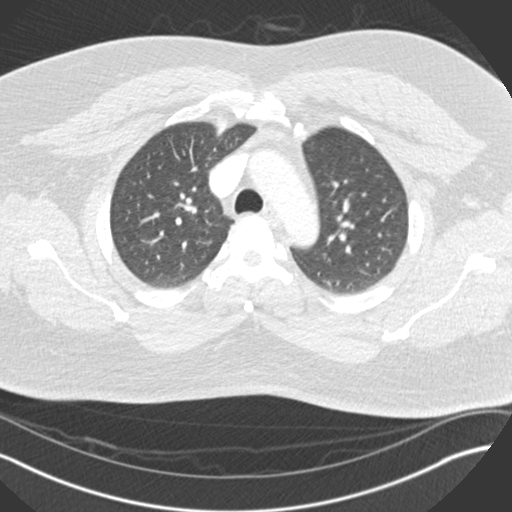
[im 293/391  mediastinal]
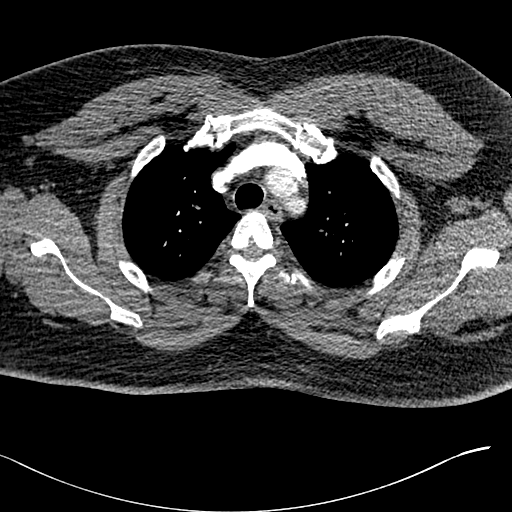
[im 313/391  lung]
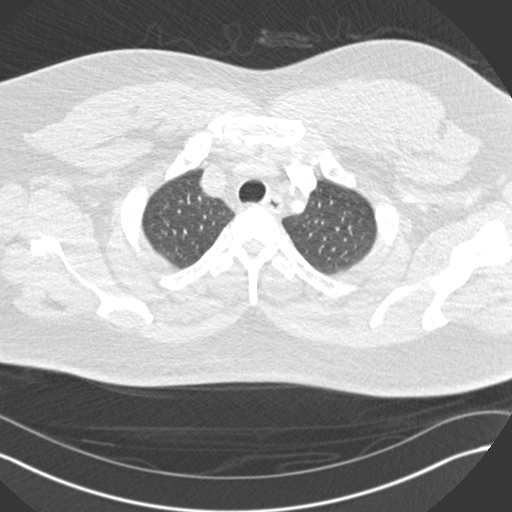
[im 332/391  mediastinal]
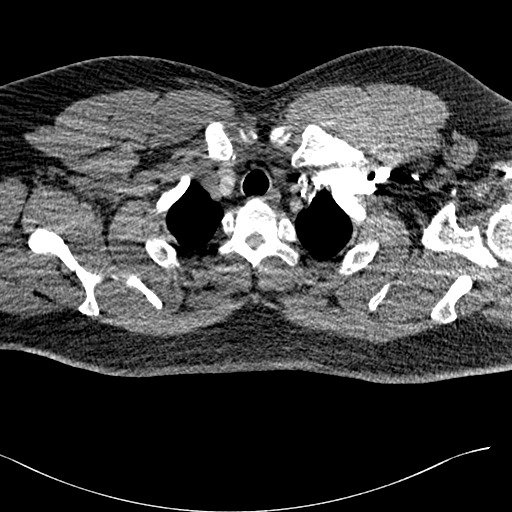
[im 352/391  lung]
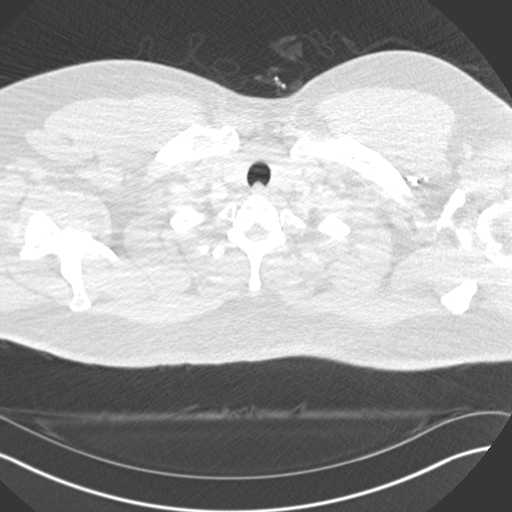
[im 371/391  mediastinal]
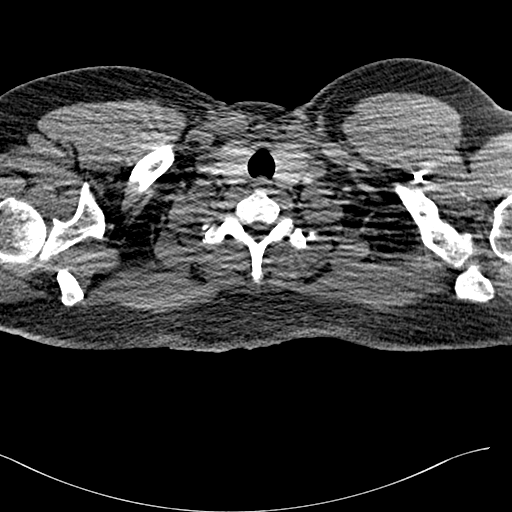

[Series 7: pe coronal mpr · coronal · 0.59mm/px · 1 of 99 slices shown]
[im 50/99  mediastinal]
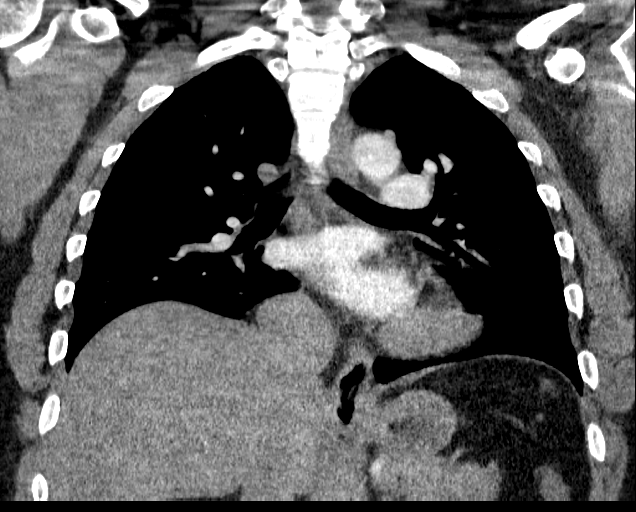

[19 of 36 positions shown; findings below may reference images not displayed]

FINDINGS: Cardiovascular: There is no demonstrable pulmonary embolus. There is
no thoracic aortic aneurysm or dissection. The visualized great
vessels appear normal. There is no pericardial effusion or
pericardial thickening evident.

Mediastinum/Nodes: Visualized thyroid appears normal. There is no
appreciable thoracic adenopathy. No esophageal lesions are
appreciable.

Lungs/Pleura: Lungs are clear. No pleural effusion or pleural
thickening evident.

Upper Abdomen: Visualized upper abdominal structures appear
unremarkable.

Musculoskeletal: There are no blastic or lytic bone lesions. No
chest wall lesions are appreciable.

Review of the MIP images confirms the above findings.
IMPRESSION: 1. No demonstrable pulmonary embolus. No thoracic aortic aneurysm or
dissection.

2.  Lungs clear.

3.  No demonstrable thoracic adenopathy.

## 2020-07-08 ENCOUNTER — Ambulatory Visit: Payer: No Typology Code available for payment source | Admitting: Sports Medicine

## 2020-07-27 ENCOUNTER — Ambulatory Visit (INDEPENDENT_AMBULATORY_CARE_PROVIDER_SITE_OTHER): Payer: No Typology Code available for payment source | Admitting: Podiatry

## 2020-07-27 DIAGNOSIS — Z5329 Procedure and treatment not carried out because of patient's decision for other reasons: Secondary | ICD-10-CM

## 2020-08-13 ENCOUNTER — Ambulatory Visit: Payer: No Typology Code available for payment source | Admitting: Podiatry

## 2020-08-24 ENCOUNTER — Encounter: Payer: Self-pay | Admitting: Podiatry

## 2020-08-24 ENCOUNTER — Ambulatory Visit (INDEPENDENT_AMBULATORY_CARE_PROVIDER_SITE_OTHER): Payer: No Typology Code available for payment source

## 2020-08-24 ENCOUNTER — Ambulatory Visit (INDEPENDENT_AMBULATORY_CARE_PROVIDER_SITE_OTHER): Payer: No Typology Code available for payment source | Admitting: Podiatry

## 2020-08-24 ENCOUNTER — Other Ambulatory Visit: Payer: Self-pay

## 2020-08-24 DIAGNOSIS — M79672 Pain in left foot: Secondary | ICD-10-CM

## 2020-08-24 DIAGNOSIS — Q828 Other specified congenital malformations of skin: Secondary | ICD-10-CM | POA: Diagnosis not present

## 2020-08-24 DIAGNOSIS — S9782XA Crushing injury of left foot, initial encounter: Secondary | ICD-10-CM | POA: Diagnosis not present

## 2020-08-24 NOTE — Progress Notes (Signed)
  Subjective:  Patient ID: Bonney Leitz., male    DOB: 27-Aug-1976,  MRN: [id number]  Chief Complaint  Patient presents with  . Foot Problem    I got hurt at work and was carrying a door and it feel on top of the leftfoot and now has a spot on the bottom of the left foot     44 y.o. male presents with the above complaint. History confirmed with patient. States the top of the foot no lnger hurts him.  Objective:  Physical Exam: warm, good capillary refill, no trophic changes or ulcerative lesions, normal DP and PT pulses and normal sensory exam. Left Foot: midfoot osteophytes, no POP. No joint crepitus, no pain on ankle ROM. Punctate keratosis plantar heel with POP.    No images are attached to the encounter.  Radiographs: X-ray of the left foot: midfoot degenerative changes, otherwise no fracture, dislocation, swelling noted Assessment:   1. Crushing injury of left heel, initial encounter   2. Porokeratosis      Plan:  Patient was evaluated and treated and all questions answered.  Left Foot Crush Injury -XR taken and reviewed. No fractures or dislocations.  Porokeratosis -Debrided and destroyed as below  Procedure: Destruction of Lesion Location: left heel Anesthesia: none Instrumentation: 15 blade. Technique: Debridement of lesion, small amount of salinocaine applied to the base of the lesion. Dressing: Dry, sterile, compression dressing. Disposition: Patient tolerated procedure well. Advised to leave dressing on for 6-8 hours. Thereafter patient to wash the area with soap and water and applied band-aid. Off-loading pads dispensed. Patient to return in 2 weeks for follow-up.   Return if symptoms worsen or fail to improve.

## 2020-12-08 ENCOUNTER — Other Ambulatory Visit: Payer: Self-pay

## 2020-12-08 ENCOUNTER — Telehealth: Payer: Self-pay

## 2020-12-08 MED ORDER — AMOXICILLIN-POT CLAVULANATE 875-125 MG PO TABS: 1.0000 | ORAL_TABLET | Freq: Two times a day (BID) | ORAL | 0 refills | Status: AC

## 2020-12-08 NOTE — Telephone Encounter (Signed)
Prescription for Augmentin sent in to [facility]. Patient was informed

## 2020-12-08 NOTE — Telephone Encounter (Signed)
Please let Jack Buckley know that we can give him Augmentin 875 1 tablet twice a day for 10 days assuming he has a sinus infection and if this does not result in good control of his issue he needs to come in and see [place].

## 2020-12-08 NOTE — Telephone Encounter (Signed)
Patient has severe sinus pressure on both sides of his nasal passages.  Also with hoarseness, headaches and bloody/brown mucus.  Symptoms began Friday.  He is using Azelastine nasal spray- 1 spray each nostril twice daily and albuterol inhaler as needed.  No fever and no loss of taste/smell.  Patient uses [facility] in [place].  Please advise.

## 2023-08-01 ENCOUNTER — Ambulatory Visit (HOSPITAL_BASED_OUTPATIENT_CLINIC_OR_DEPARTMENT_OTHER)
Admission: EM | Admit: 2023-08-01 | Discharge: 2023-08-01 | Disposition: A | Discharge status: Home / Self Care | Payer: No Typology Code available for payment source | Attending: Internal Medicine | Admitting: Internal Medicine

## 2023-08-01 ENCOUNTER — Other Ambulatory Visit (HOSPITAL_BASED_OUTPATIENT_CLINIC_OR_DEPARTMENT_OTHER): Payer: Self-pay

## 2023-08-01 ENCOUNTER — Encounter (HOSPITAL_BASED_OUTPATIENT_CLINIC_OR_DEPARTMENT_OTHER): Payer: Self-pay

## 2023-08-01 DIAGNOSIS — R350 Frequency of micturition: Secondary | ICD-10-CM | POA: Insufficient documentation

## 2023-08-01 DIAGNOSIS — R5383 Other fatigue: Secondary | ICD-10-CM | POA: Insufficient documentation

## 2023-08-01 DIAGNOSIS — Z8639 Personal history of other endocrine, nutritional and metabolic disease: Secondary | ICD-10-CM | POA: Insufficient documentation

## 2023-08-01 DIAGNOSIS — F411 Generalized anxiety disorder: Secondary | ICD-10-CM | POA: Insufficient documentation

## 2023-08-01 DIAGNOSIS — R42 Dizziness and giddiness: Secondary | ICD-10-CM | POA: Insufficient documentation

## 2023-08-01 DIAGNOSIS — R682 Dry mouth, unspecified: Secondary | ICD-10-CM | POA: Insufficient documentation

## 2023-08-01 DIAGNOSIS — Z113 Encounter for screening for infections with a predominantly sexual mode of transmission: Secondary | ICD-10-CM | POA: Insufficient documentation

## 2023-08-01 LAB — POCT URINALYSIS DIP (MANUAL ENTRY)
Bilirubin, UA: NEGATIVE
Blood, UA: NEGATIVE
Glucose, UA: NEGATIVE mg/dL
Ketones, POC UA: NEGATIVE mg/dL
Leukocytes, UA: NEGATIVE
Nitrite, UA: NEGATIVE
Protein Ur, POC: NEGATIVE mg/dL
Spec Grav, UA: 1.01 (ref 1.010–1.025)
Urobilinogen, UA: 0.2 U/dL
pH, UA: 5.5 (ref 5.0–8.0)

## 2023-08-01 LAB — POCT FASTING CBG KUC MANUAL ENTRY: POCT Glucose (KUC): 92 mg/dL (ref 70–99)

## 2023-08-01 MED ORDER — HYDROXYZINE HCL 10 MG PO TABS
10.0000 mg | ORAL_TABLET | Freq: Four times a day (QID) | ORAL | 0 refills | Status: AC
  Filled 2023-08-01: qty 30, 8d supply, fill #0

## 2023-08-01 NOTE — Discharge Instructions (Signed)
Take hydroxyzine at bedtime to help with anxiety and sleep.  Continue with diet and exercise to improve health.  I have drawn blood work today to test for electrolyte imbalances.  I will call you if any of your blood work is abnormal.  STD testing will come back in the next 2 to 3 days and I will call you if anything is abnormal.  For dry mouth: Rinsing with col? water, sucking on ice chips, and chewing sugarless gum to increase salivation may provide comfort.   Follow up with PCP, [facility] will be calling you to set up a primary care appointment when able to.  If you develop any new or worsening symptoms or if your symptoms do not start to improve, please return here or follow-up with your primary care provider. If your symptoms are severe, please go to the emergency room.

## 2023-08-01 NOTE — ED Provider Notes (Signed)
[name]    CSN: [id number] Arrival date & time: 08/01/23  4270      History   Chief Complaint Chief Complaint  Patient presents with   Urinary Frequency   Dizziness    HPI Jack Buckley. is a 47 y.o. male.   Jack Buckley. is a 47 y.o. male presenting for chief complaint of urinary frequency, intermittent dizziness, dry mouth, and fatigue that started approximately 3 weeks ago. 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 He has increased his water intake due to dry mouth, which he believes may be causing urinary frequency. He only drinks water and denies frequent intake of sodas, tea, and other urinary irritants. Dizziness comes and goes and is mostly with position changes. He has been improving his diet recently in attempt to get healthier and has cut back on salt/sugar in diet, he wonders if this could be causing symptoms. Eating regular meals without skipped meals. Additionally reports episodes of brain fog, stress, and loneliness over the last 3 weeks since other symptoms started. Denies SI/HI, has never been diagnosed formally with depression/anxiety, and does not take any medications for symptoms daily. Denies nausea, vomiting, diarrhea, abdominal pain, heart palpitations, chest pain, shortness of breath, constipation, fevers, chills.  He would like to be tested for STDs today, denies recent new sexual partners and penile discharge/rash.   Urinary Frequency  Dizziness   Past Medical History:  Diagnosis Date   Chest pain 08/03/2018   Shortness of breath 08/03/2018   Sleep apnea     Patient Active Problem List   Diagnosis Date Noted   Family history of hypercoagulability 06/04/2020   Bilateral lower extremity edema 05/01/2020   Tachycardia 05/01/2020   Family history of premature coronary artery disease 04/19/2019   Anxiety 04/17/2019   History of cardiac catheterization 03/27/2019    Prediabetes 03/27/2019   Atypical chest pain 01/12/2019   Gastroesophageal reflux disease 10/01/2018   Regurgitation of food 10/01/2018   Change in bowel habits 10/01/2018   Rectal bleeding 10/01/2018   Unintentional weight loss 10/01/2018   Exposure to carbon monoxide 10/01/2018   Adult BMI 50.0-59.9 kg/sq m (HCC) 08/16/2018   Non-cardiac chest pain 08/03/2018   Shortness of breath 08/03/2018   Sleep apnea 08/03/2018    Past Surgical History:  Procedure Laterality Date   LEFT HEART CATH AND CORONARY ANGIOGRAPHY N/A 08/13/2018   Procedure: LEFT HEART CATH AND CORONARY ANGIOGRAPHY;  Surgeon: Yvonne Kendall, MD;  [facility];  Service: Cardiovascular;  Laterality: N/A;       Home Medications    Prior to Admission medications   Medication Sig Start Date End Date Taking? Authorizing Provider  hydrOXYzine (ATARAX) 10 MG tablet Take 1 tablet (10 mg total) by mouth every 6 (six) hours. 08/01/23  Yes StanhopeDonavan Burnet, FNP    Family History Family History  Problem Relation Age of Onset   Heart disease Mother    Hypertension Mother    Hyperlipidemia Mother    Asthma Mother    Hyperlipidemia Father    Diabetes Maternal Aunt    Colon cancer Neg Hx    Esophageal cancer Neg Hx    Inflammatory bowel disease Neg Hx    Liver disease Neg Hx    Pancreatic cancer Neg Hx    Rectal cancer Neg Hx    Stomach cancer Neg Hx     Social  History Social History   Tobacco Use   Smoking status: Never   Smokeless tobacco: Never  Vaping Use   Vaping status: Never Used  Substance Use Topics   Alcohol use: Yes    Comment: occ   Drug use: Never     Allergies   Paroxetine   Review of Systems Review of Systems  Genitourinary:  Positive for frequency.  Neurological:  Positive for dizziness.  Per HPI   Physical Exam Triage Vital Signs ED Triage Vitals  Encounter Vitals Group     BP 08/01/23 0943 135/78     Systolic BP Percentile --      Diastolic BP  Percentile --      Pulse Rate 08/01/23 0943 82     Resp 08/01/23 0943 18     Temp 08/01/23 0943 98.8 F (37.1 C)     Temp Source 08/01/23 0943 Oral     SpO2 08/01/23 0943 98 %     Weight --      Height --      Head Circumference --      Peak Flow --      Pain Score 08/01/23 0944 0     Pain Loc --      Pain Education --      Exclude from Growth Chart --    No data found.  Updated Vital Signs BP 135/78 (BP Location: Right Arm)   Pulse 82   Temp 98.8 F (37.1 C) (Oral)   Resp 18   SpO2 98%   Visual Acuity Right Eye Distance:   Left Eye Distance:   Bilateral Distance:    Right Eye Near:   Left Eye Near:    Bilateral Near:     Physical Exam Vitals and nursing note reviewed.  Constitutional:      Appearance: He is not ill-appearing or toxic-appearing.  HENT:     Head: Normocephalic and atraumatic.     Right Ear: Hearing, tympanic membrane, ear canal and external ear normal.     Left Ear: Hearing, tympanic membrane, ear canal and external ear normal.     Nose: Nose normal.     Mouth/Throat:     Lips: Pink.     Mouth: Mucous membranes are moist. No injury.     Tongue: No lesions. Tongue does not deviate from midline.     Palate: No mass and lesions.     Pharynx: Oropharynx is clear. Uvula midline. No pharyngeal swelling, oropharyngeal exudate, posterior oropharyngeal erythema or uvula swelling.     Tonsils: No tonsillar exudate or tonsillar abscesses.  Eyes:     General: Lids are normal. Vision grossly intact. Gaze aligned appropriately.     Extraocular Movements: Extraocular movements intact.     Conjunctiva/sclera: Conjunctivae normal.  Cardiovascular:     Rate and Rhythm: Normal rate and regular rhythm.     Heart sounds: Normal heart sounds, S1 normal and S2 normal.  Pulmonary:     Effort: Pulmonary effort is normal. No respiratory distress.     Breath sounds: Normal breath sounds and air entry. No wheezing, rhonchi or rales.  Chest:     Chest wall: No  tenderness.  Musculoskeletal:     Cervical back: Neck supple.     Right lower leg: No edema.     Left lower leg: No edema.  Lymphadenopathy:     Cervical: No cervical adenopathy.  Skin:    General: Skin is warm and dry.     Capillary Refill:  Capillary refill takes less than 2 seconds.     Findings: No rash.  Neurological:     General: No focal deficit present.     Mental Status: He is alert and oriented to person, place, and time. Mental status is at baseline.     Cranial Nerves: No dysarthria or facial asymmetry.  Psychiatric:        Mood and Affect: Mood normal.        Speech: Speech normal.        Behavior: Behavior normal.        Thought Content: Thought content normal.        Judgment: Judgment normal.      UC Treatments / Results  Labs (all labs ordered are listed, but only abnormal results are displayed) Labs Reviewed  CBC  BASIC METABOLIC PANEL  HIV ANTIBODY (ROUTINE TESTING W REFLEX)  RPR  TSH  POCT FASTING CBG [facility] MANUAL ENTRY  POCT URINALYSIS DIP (MANUAL ENTRY)  CYTOLOGY, (ORAL, ANAL, URETHRAL) ANCILLARY ONLY    EKG   Radiology No results found.  Procedures Procedures (including critical care time)  Medications Ordered in UC Medications - No data to display  Initial Impression / Assessment and Plan / UC Course  I have reviewed the triage vital signs and the nursing notes.  Pertinent labs & imaging results that were available during my care of the patient were reviewed by me and considered in my medical decision making (see chart for details).   1. Anxiety state, urinary frequency, encounter for screening for STDs Urinalysis is normal, CBG ordered by nursing staff normal. History of prediabetes, low suspicion for type 2 DM progression.  STD testing pending, staff will notify patient of positive results, safe sex precautions discussed.  CBC, BMP, and TSH pending to evaluate for electrolyte/thyroid dysfunction contributing to symptoms.  He is  currently getting 5-6 hours of sleep nightly, will use hydroxyzine to assist with sleep hygiene and anxiety.  Name placed on list for PCP appointment with [facility]. Encouraged PCP follow-up for ongoing evaluation and management of health and lifestyle changes to improve symptoms.  Counseled patient on potential for adverse effects with medications prescribed/recommended today, strict ER and return-to-clinic precautions discussed, patient verbalized understanding.    Final Clinical Impressions(s) / UC Diagnoses   Final diagnoses:  Anxiety state  Urinary frequency  Encounter for screening examination for sexually transmitted disease     Discharge Instructions      Take hydroxyzine at bedtime to help with anxiety and sleep.  Continue with diet and exercise to improve health.  I have drawn blood work today to test for electrolyte imbalances.  I will call you if any of your blood work is abnormal.  STD testing will come back in the next 2 to 3 days and I will call you if anything is abnormal.  For dry mouth: Rinsing with col? water, sucking on ice chips, and chewing sugarless gum to increase salivation may provide comfort.   Follow up with PCP, someone for [facility] will be calling you to set up a primary care appointment when able to.  If you develop any new or worsening symptoms or if your symptoms do not start to improve, please return here or follow-up with your primary care provider. If your symptoms are severe, please go to the emergency room.     ED Prescriptions     Medication Sig Dispense Auth. Provider   hydrOXYzine (ATARAX) 10 MG tablet Take 1 tablet (  10 mg total) by mouth every 6 (six) hours. 30 tablet Carlisle Beers, FNP      PDMP not reviewed this encounter.   Carlisle Beers, [place] 08/01/23 1216

## 2023-08-01 NOTE — ED Triage Notes (Signed)
Pt c/o frequent urination, dry mouth, dizziness, fatigue, sensitive skin, hives, and anxious for over 3 wks and seems to be getting worse. Taking vitamins and OTC meds with no relief.

## 2023-08-02 LAB — CYTOLOGY, (ORAL, ANAL, URETHRAL) ANCILLARY ONLY
Chlamydia: NEGATIVE
Comment: NEGATIVE
Comment: NEGATIVE
Comment: NORMAL
Neisseria Gonorrhea: NEGATIVE
Trichomonas: NEGATIVE

## 2023-08-02 LAB — CBC
Hematocrit: 40.5 % (ref 37.5–51.0)
Hemoglobin: 13.1 g/dL (ref 13.0–17.7)
MCH: 27.3 pg (ref 26.6–33.0)
MCHC: 32.3 g/dL (ref 31.5–35.7)
MCV: 85 fL (ref 79–97)
Platelets: 356 10*3/uL (ref 150–450)
RBC: 4.79 x10E6/uL (ref 4.14–5.80)
RDW: 13.8 % (ref 11.6–15.4)
WBC: 4.8 10*3/uL (ref 3.4–10.8)

## 2023-08-02 LAB — BASIC METABOLIC PANEL
BUN/Creatinine Ratio: 13 (ref 9–20)
BUN: 13 mg/dL (ref 6–24)
CO2: 23 mmol/L (ref 20–29)
Calcium: 9.4 mg/dL (ref 8.7–10.2)
Chloride: 103 mmol/L (ref 96–106)
Creatinine, Ser: 1.02 mg/dL (ref 0.76–1.27)
Glucose: 98 mg/dL (ref 70–99)
Potassium: 4.6 mmol/L (ref 3.5–5.2)
Sodium: 138 mmol/L (ref 134–144)
eGFR: 92 mL/min/{1.73_m2} (ref >59)

## 2023-08-02 LAB — HIV ANTIBODY (ROUTINE TESTING W REFLEX): HIV Screen 4th Generation wRfx: NONREACTIVE

## 2023-08-02 LAB — TSH: TSH: 0.456 u[IU]/mL (ref 0.450–4.500)

## 2023-08-02 LAB — RPR: RPR Ser Ql: NONREACTIVE

## 2023-11-25 ENCOUNTER — Encounter (HOSPITAL_BASED_OUTPATIENT_CLINIC_OR_DEPARTMENT_OTHER): Payer: Self-pay

## 2023-11-25 ENCOUNTER — Ambulatory Visit (INDEPENDENT_AMBULATORY_CARE_PROVIDER_SITE_OTHER)
Admit: 2023-11-25 | Discharge: 2023-11-25 | Disposition: A | Discharge status: Home / Self Care | Payer: Self-pay | Attending: Family Medicine | Admitting: Family Medicine

## 2023-11-25 ENCOUNTER — Ambulatory Visit (HOSPITAL_BASED_OUTPATIENT_CLINIC_OR_DEPARTMENT_OTHER)
Admission: EM | Admit: 2023-11-25 | Discharge: 2023-11-25 | Disposition: A | Discharge status: Home / Self Care | Payer: Self-pay | Attending: Family Medicine | Admitting: Family Medicine

## 2023-11-25 DIAGNOSIS — Y9383 Activity, rough housing and horseplay: Secondary | ICD-10-CM

## 2023-11-25 DIAGNOSIS — S79911A Unspecified injury of right hip, initial encounter: Secondary | ICD-10-CM

## 2023-11-25 DIAGNOSIS — M25551 Pain in right hip: Secondary | ICD-10-CM

## 2023-11-25 MED ORDER — TRAMADOL HCL 50 MG PO TABS: 50.0000 mg | ORAL_TABLET | Freq: Four times a day (QID) | ORAL | 0 refills | Status: AC | PRN

## 2023-11-25 MED ORDER — CYCLOBENZAPRINE HCL 10 MG PO TABS
10.0000 mg | ORAL_TABLET | Freq: Two times a day (BID) | ORAL | 0 refills | Status: AC | PRN
Start: 2023-11-25 — End: ?

## 2023-11-25 MED ORDER — KETOROLAC TROMETHAMINE 30 MG/ML IJ SOLN
30.0000 mg | Freq: Once | INTRAMUSCULAR | Status: AC
  Administered 2023-11-25: 30 mg via INTRAMUSCULAR

## 2023-11-25 NOTE — Discharge Instructions (Addendum)
 I will call you when I get the results of your x-ray.  We gave you some Toradol here for pain.  You can continue with Tylenol extra strength every 6-8 hours as needed. Call [facility] on Monday to schedule. I would be non weight bearing on crutches until then.

## 2023-11-25 NOTE — ED Provider Notes (Signed)
 [facility]    CSN: [id number] Arrival date & time: 11/25/23  1127      History   Chief Complaint Chief Complaint  Patient presents with   Hip Pain    HPI Jack Buckley. is a 48 y.o. male.   48 year old male presents today with hip pain.  The pain is in the right hip.  The pain started last night.  Reports Thursday night was wrestling with his son on the mat.  Denies any specific injuries at this time or pops or weird clicks in the area.  Patient does appear to be in pain.  Denies any numbness or tingling.  Took Tylenol and ibuprofen last night without much relief. No fevers or chills or trouble urinating.   Hip Pain    Past Medical History:  Diagnosis Date   Chest pain 08/03/2018   Shortness of breath 08/03/2018   Sleep apnea     Patient Active Problem List   Diagnosis Date Noted   Family history of hypercoagulability 06/04/2020   Bilateral lower extremity edema 05/01/2020   Tachycardia 05/01/2020   Family history of premature coronary artery disease 04/19/2019   Anxiety 04/17/2019   History of cardiac catheterization 03/27/2019   Prediabetes 03/27/2019   Atypical chest pain 01/12/2019   Gastroesophageal reflux disease 10/01/2018   Regurgitation of food 10/01/2018   Change in bowel habits 10/01/2018   Rectal bleeding 10/01/2018   Unintentional weight loss 10/01/2018   Exposure to carbon monoxide 10/01/2018   Adult BMI 50.0-59.9 kg/sq m (HCC) 08/16/2018   Non-cardiac chest pain 08/03/2018   Shortness of breath 08/03/2018   Sleep apnea 08/03/2018    Past Surgical History:  Procedure Laterality Date   LEFT HEART CATH AND CORONARY ANGIOGRAPHY N/A 08/13/2018   Procedure: LEFT HEART CATH AND CORONARY ANGIOGRAPHY;  Surgeon: Yvonne Kendall, MD;  Location: [facility];  Service: Cardiovascular;  Laterality: N/A;       Home Medications    Prior to Admission medications   Medication Sig Start Date End Date Taking? Authorizing Provider   cyclobenzaprine (FLEXERIL) 10 MG tablet Take 1 tablet (10 mg total) by mouth 2 (two) times daily as needed for muscle spasms. 11/25/23  Yes Adrik Khim A, FNP  traMADol (ULTRAM) 50 MG tablet Take 1 tablet (50 mg total) by mouth every 6 (six) hours as needed. 11/25/23  Yes Mayer Vondrak A, FNP  hydrOXYzine (ATARAX) 10 MG tablet Take 1 tablet (10 mg total) by mouth every 6 (six) hours. 08/01/23   Carlisle Beers, FNP    Family History Family History  Problem Relation Age of Onset   Heart disease Mother    Hypertension Mother    Hyperlipidemia Mother    Asthma Mother    Hyperlipidemia Father    Diabetes Maternal Aunt    Colon cancer Neg Hx    Esophageal cancer Neg Hx    Inflammatory bowel disease Neg Hx    Liver disease Neg Hx    Pancreatic cancer Neg Hx    Rectal cancer Neg Hx    Stomach cancer Neg Hx     Social History Social History   Tobacco Use   Smoking status: Never   Smokeless tobacco: Never  Vaping Use   Vaping status: Never Used  Substance Use Topics   Alcohol use: Yes    Comment: occ   Drug use: Never     Allergies   Paroxetine   Review of Systems Review of Systems  Physical Exam Triage Vital Signs ED Triage Vitals  Encounter Vitals Group     BP 11/25/23 1137 (!) 148/99     Systolic BP Percentile --      Diastolic BP Percentile --      Pulse Rate 11/25/23 1137 81     Resp 11/25/23 1137 20     Temp 11/25/23 1137 98.6 F (37 C)     Temp Source 11/25/23 1137 Oral     SpO2 11/25/23 1137 98 %     Weight --      Height --      Head Circumference --      Peak Flow --      Pain Score 11/25/23 1138 9     Pain Loc --      Pain Education --      Exclude from Growth Chart --    No data found.  Updated Vital Signs BP (!) 148/99 (BP Location: Left Arm)   Pulse 81   Temp 98.6 F (37 C) (Oral)   Resp 20   SpO2 98%   Visual Acuity Right Eye Distance:   Left Eye Distance:   Bilateral Distance:    Right Eye Near:   Left Eye Near:     Bilateral Near:     Physical Exam   UC Treatments / Results  Labs (all labs ordered are listed, but only abnormal results are displayed) Labs Reviewed - No data to display  EKG   Radiology DG Hip Unilat With Pelvis 2-3 Views Right Result Date: 11/25/2023 CLINICAL DATA:  Injured right hip all showing son wrestling moves 2 nights ago. Increasing pain. EXAM: DG HIP (WITH OR WITHOUT PELVIS) 2-3V RIGHT COMPARISON:  CT abdomen and pelvis 09/04/2019 FINDINGS: The right femoroacetabular joint space is maintained. Mild superolateral left acetabular degenerative osteophytosis. The bilateral sacroiliac and pubic symphysis joint spaces are maintained. Mild chronic appearing mineralization just lateral to the distal aspect of the right greater trochanter, possible chronic enthesopathic change at the gluteal abductor tendon insertions. IMPRESSION: 1. No acute fracture. 2. There is mineralization just lateral to the right greater trochanter at the right gluteus medius/minimus tendon insertions. This may represent the chronic enthesopathic change at the tendon insertions. This appears increased from CT abdomen and pelvis 09/04/2019, however there was a small amount of mineralization in the same location on that prior CT. Recommend clinical correlation for acute pain at the greater tuberosity which may reflect a more acute to subacute avulsion injury at the tendon insertions. Electronically Signed   By: Neita Garnet M.D.   On: 11/25/2023 13:04    Procedures Procedures (including critical care time)  Medications Ordered in UC Medications  ketorolac (TORADOL) 30 MG/ML injection 30 mg (30 mg Intramuscular Given 11/25/23 1156)    Initial Impression / Assessment and Plan / UC Course  I have reviewed the triage vital signs and the nursing notes.  Pertinent labs & imaging results that were available during my care of the patient were reviewed by me and considered in my medical decision making (see chart for  details).     Right hip injury-some concerns on x-ray of acute to subacute avulsion injury at the tendon insertions of the right hip.  This was compared to previous images of the hip.  Patient is having trouble ambulating on the hip without severe pain. Recommended nonweightbearing with crutches at this time. Ice the area,  Gave Toradol here for pain and inflammation Also prescribing tramadol for severe pain as  needed Gave contact for [facility] to follow-up on Monday for further evaluation and management of this injury Final Clinical Impressions(s) / UC Diagnoses   Final diagnoses:  Injury of right hip, initial encounter     Discharge Instructions      I will call you when I get the results of your x-ray.  We gave you some Toradol here for pain.  You can continue with Tylenol extra strength every 6-8 hours as needed. Call [facility] on Monday to schedule. I would be non weight bearing on crutches until then.     ED Prescriptions     Medication Sig Dispense Auth. Provider   cyclobenzaprine (FLEXERIL) 10 MG tablet Take 1 tablet (10 mg total) by mouth 2 (two) times daily as needed for muscle spasms. 20 tablet Jasper Hanf A, FNP   traMADol (ULTRAM) 50 MG tablet Take 1 tablet (50 mg total) by mouth every 6 (six) hours as needed. 20 tablet Dahlia Byes A, FNP      I have reviewed the PDMP during this encounter.   Janace Aris, FNP 11/25/23 [phone]

## 2023-11-25 NOTE — ED Triage Notes (Signed)
 Injured right hip while showing son wrestling moves Thursday night. States pain to hip getting progressively worse since. Facial grimacing when moving hip. States severe pain when ambulating. Has had nothing for pain since last night.

## 2023-11-27 ENCOUNTER — Telehealth: Payer: Self-pay

## 2023-11-27 NOTE — Telephone Encounter (Signed)
 Patient's wife called. Her husband was seen for his right hip injury at [facility].  He was told to follow up with Dr.Moore since he was on call.  Please schedule where Dr.Moore sees fits, cause I was not sure. [phone]

## 2023-11-27 NOTE — Telephone Encounter (Signed)
 Scheduled for 2pm 12/07/23

## 2023-12-07 ENCOUNTER — Ambulatory Visit: Payer: Self-pay | Admitting: Orthopedic Surgery

## 2024-03-18 DIAGNOSIS — R001 Bradycardia, unspecified: Secondary | ICD-10-CM | POA: Diagnosis not present

## 2024-03-27 ENCOUNTER — Encounter: Payer: Self-pay | Admitting: Internal Medicine

## 2024-06-12 ENCOUNTER — Other Ambulatory Visit (HOSPITAL_BASED_OUTPATIENT_CLINIC_OR_DEPARTMENT_OTHER): Payer: Self-pay

## 2024-06-12 ENCOUNTER — Ambulatory Visit (HOSPITAL_BASED_OUTPATIENT_CLINIC_OR_DEPARTMENT_OTHER): Admission: EM | Admit: 2024-06-12 | Discharge: 2024-06-12 | Disposition: A | Discharge status: Home / Self Care

## 2024-06-12 ENCOUNTER — Encounter (HOSPITAL_BASED_OUTPATIENT_CLINIC_OR_DEPARTMENT_OTHER): Payer: Self-pay

## 2024-06-12 DIAGNOSIS — R112 Nausea with vomiting, unspecified: Secondary | ICD-10-CM | POA: Diagnosis not present

## 2024-06-12 DIAGNOSIS — R197 Diarrhea, unspecified: Secondary | ICD-10-CM | POA: Diagnosis not present

## 2024-06-12 DIAGNOSIS — A084 Viral intestinal infection, unspecified: Secondary | ICD-10-CM

## 2024-06-12 MED ORDER — ONDANSETRON 4 MG PO TBDP
4.0000 mg | ORAL_TABLET | Freq: Three times a day (TID) | ORAL | 0 refills | Status: AC | PRN
  Filled 2024-06-12: qty 20, 7d supply, fill #0

## 2024-06-12 NOTE — ED Provider Notes (Signed)
 [facility]    CSN: [id number] Arrival date & time: 06/12/24  1007      History   Chief Complaint Chief Complaint  Patient presents with   Abdominal Pain    HPI Jack Buckley. is a 48 y.o. male.   48 year old male who reports that he had a stomach bug that started on approximately 06/02/2024 or earlier.  He had nausea, vomiting, diarrhea.  He has not been back to work and needs a note saying he is safe to go back to work.  He has been weak and has still had some mild abdominal pain.  Last vomiting and diarrhea was on 06/09/2024.  Pepto-Bismol and Tums have given him some relief.  He has tried to work to stay hydrated.   Abdominal Pain Associated symptoms: diarrhea, nausea and vomiting   Associated symptoms: no chest pain, no chills, no constipation, no cough, no dysuria, no fever, no hematuria and no sore throat     Past Medical History:  Diagnosis Date   Chest pain 08/03/2018   Shortness of breath 08/03/2018   Sleep apnea     Patient Active Problem List   Diagnosis Date Noted   Family history of hypercoagulability 06/04/2020   Bilateral lower extremity edema 05/01/2020   Tachycardia 05/01/2020   Family history of premature coronary artery disease 04/19/2019   Anxiety 04/17/2019   History of cardiac catheterization 03/27/2019   Prediabetes 03/27/2019   Atypical chest pain 01/12/2019   Gastroesophageal reflux disease 10/01/2018   Regurgitation of food 10/01/2018   Change in bowel habits 10/01/2018   Rectal bleeding 10/01/2018   Unintentional weight loss 10/01/2018   Exposure to carbon monoxide 10/01/2018   Adult BMI 50.0-59.9 kg/sq m (HCC) 08/16/2018   Non-cardiac chest pain 08/03/2018   Shortness of breath 08/03/2018   Sleep apnea 08/03/2018    Past Surgical History:  Procedure Laterality Date   LEFT HEART CATH AND CORONARY ANGIOGRAPHY N/A 08/13/2018   Procedure: LEFT HEART CATH AND CORONARY ANGIOGRAPHY;  Surgeon: Mady Bruckner, MD;   Location: [facility];  Service: Cardiovascular;  Laterality: N/A;       Home Medications    Prior to Admission medications   Medication Sig Start Date End Date Taking? Authorizing Provider  aspirin  EC 81 MG tablet Take 81 mg by mouth daily. 09/12/18  Yes [provider]  ondansetron  (ZOFRAN -ODT) 4 MG disintegrating tablet Take 1 tablet (4 mg total) by mouth every 8 (eight) hours as needed for nausea or vomiting. 06/12/24  Yes Ival Domino, FNP  cyclobenzaprine  (FLEXERIL ) 10 MG tablet Take 1 tablet (10 mg total) by mouth 2 (two) times daily as needed for muscle spasms. 11/25/23   Adah Corning A, FNP  hydrOXYzine  (ATARAX ) 10 MG tablet Take 1 tablet (10 mg total) by mouth every 6 (six) hours. 08/01/23   Enedelia Dorna HERO, FNP  traMADol  (ULTRAM ) 50 MG tablet Take 1 tablet (50 mg total) by mouth every 6 (six) hours as needed. 11/25/23   Adah Corning LABOR, FNP    Family History Family History  Problem Relation Age of Onset   Heart disease Mother    Hypertension Mother    Hyperlipidemia Mother    Asthma Mother    Hyperlipidemia Father    Diabetes Maternal Aunt    Colon cancer Neg Hx    Esophageal cancer Neg Hx    Inflammatory bowel disease Neg Hx    Liver disease Neg Hx    Pancreatic cancer Neg Hx  Rectal cancer Neg Hx    Stomach cancer Neg Hx     Social History Social History   Tobacco Use   Smoking status: Never   Smokeless tobacco: Never  Vaping Use   Vaping status: Never Used  Substance Use Topics   Alcohol use: Yes    Comment: occ   Drug use: Never     Allergies   Paroxetine   Review of Systems Review of Systems  Constitutional:  Negative for chills and fever.  HENT:  Negative for ear pain and sore throat.   Eyes:  Negative for pain and visual disturbance.  Respiratory:  Negative for cough.   Cardiovascular:  Negative for chest pain and palpitations.  Gastrointestinal:  Positive for abdominal pain, diarrhea, nausea and vomiting. Negative for  constipation.  Genitourinary:  Negative for dysuria and hematuria.  Musculoskeletal:  Negative for arthralgias and back pain.  Skin:  Negative for color change and rash.  Neurological:  Negative for seizures and syncope.  All other systems reviewed and are negative.    Physical Exam Triage Vital Signs ED Triage Vitals  Encounter Vitals Group     BP 06/12/24 1102 116/72     Girls Systolic BP Percentile --      Girls Diastolic BP Percentile --      Boys Systolic BP Percentile --      Boys Diastolic BP Percentile --      Pulse Rate 06/12/24 1102 62     Resp 06/12/24 1102 20     Temp 06/12/24 1102 98.4 F (36.9 C)     Temp Source 06/12/24 1102 Oral     SpO2 06/12/24 1102 97 %     Weight --      Height --      Head Circumference --      Peak Flow --      Pain Score 06/12/24 1101 0     Pain Loc --      Pain Education --      Exclude from Growth Chart --    No data found.  Updated Vital Signs BP 116/72 (BP Location: Right Arm)   Pulse 62   Temp 98.4 F (36.9 C) (Oral)   Resp 20   SpO2 97%   Visual Acuity Right Eye Distance:   Left Eye Distance:   Bilateral Distance:    Right Eye Near:   Left Eye Near:    Bilateral Near:     Physical Exam Vitals and nursing note reviewed.  Constitutional:      General: He is not in acute distress.    Appearance: He is well-developed. He is not ill-appearing or toxic-appearing.  HENT:     Head: Normocephalic and atraumatic.     Right Ear: Hearing, tympanic membrane, ear canal and external ear normal.     Left Ear: Hearing, tympanic membrane, ear canal and external ear normal.     Nose: No congestion or rhinorrhea.     Right Sinus: No maxillary sinus tenderness or frontal sinus tenderness.     Left Sinus: No maxillary sinus tenderness or frontal sinus tenderness.     Mouth/Throat:     Lips: Pink.     Mouth: Mucous membranes are moist.     Pharynx: Uvula midline. No oropharyngeal exudate or posterior oropharyngeal erythema.      Tonsils: No tonsillar exudate.  Eyes:     Conjunctiva/sclera: Conjunctivae normal.     Pupils: Pupils are equal, round, and reactive to light.  Cardiovascular:     Rate and Rhythm: Normal rate and regular rhythm.     Heart sounds: S1 normal and S2 normal. No murmur heard. Pulmonary:     Effort: Pulmonary effort is normal. No respiratory distress.     Breath sounds: Normal breath sounds. No decreased breath sounds, wheezing, rhonchi or rales.  Abdominal:     General: Bowel sounds are normal.     Palpations: Abdomen is soft.     Tenderness: There is abdominal tenderness (Mild and intermittent abdominal pain.) in the right upper quadrant, right lower quadrant, epigastric area, suprapubic area, left upper quadrant and left lower quadrant. There is no right CVA tenderness, left CVA tenderness, guarding or rebound. Negative signs include Murphy's sign, Rovsing's sign and McBurney's sign.  Musculoskeletal:        General: No swelling.     Cervical back: Neck supple.  Lymphadenopathy:     Head:     Right side of head: No submental, submandibular, tonsillar, preauricular or posterior auricular adenopathy.     Left side of head: No submental, submandibular, tonsillar, preauricular or posterior auricular adenopathy.     Cervical: No cervical adenopathy.     Right cervical: No superficial cervical adenopathy.    Left cervical: No superficial cervical adenopathy.  Skin:    General: Skin is warm and dry.     Capillary Refill: Capillary refill takes less than 2 seconds.     Findings: No rash.  Neurological:     Mental Status: He is alert and oriented to person, place, and time.  Psychiatric:        Mood and Affect: Mood normal.      UC Treatments / Results  Labs (all labs ordered are listed, but only abnormal results are displayed) Labs Reviewed - No data to display  EKG   Radiology No results found.  Procedures Procedures (including critical care time)  Medications Ordered in  UC Medications - No data to display  Initial Impression / Assessment and Plan / UC Course  I have reviewed the triage vital signs and the nursing notes.  Pertinent labs & imaging results that were available during my care of the patient were reviewed by me and considered in my medical decision making (see chart for details).  Plan of Care: Viral gastroenteritis with nausea, vomiting and diarrhea: Ondansetron , 4 mg, melts on tongue, every 8 hours as needed for nausea and vomiting.  Provided handouts and education on nausea vomiting and diarrhea.  Start with a clear liquid diet and slowly advance to bland foods such as bananas, rice, applesauce, toast and other starchy foods.  Advance diet as tolerated.  Encouraged good hydration and replacement of potassium in the diet (bananas, cantaloupe, orange juice, oral rehydration solutions).  Follow-up here as needed.  Work excuse provided.  I reviewed the plan of care with the patient and/or the patient's guardian.  The patient and/or guardian had time to ask questions and acknowledged that the questions were answered.  I provided instruction on symptoms or reasons to return here or to go to an ER, if symptoms/condition did not improve, worsened or if new symptoms occurred.  Final Clinical Impressions(s) / UC Diagnoses   Final diagnoses:  Viral gastroenteritis  Nausea and vomiting, unspecified vomiting type  Diarrhea, unspecified type     Discharge Instructions      Viral gastroenteritis with nausea, vomiting and diarrhea: Ondansetron , 4 mg, melts on tongue, every 8 hours as needed for nausea and vomiting.  Provided  handouts and education on nausea vomiting and diarrhea.  Start with a clear liquid diet and slowly advance to bland foods such as bananas, rice, applesauce, toast and other starchy foods.  Advance diet as tolerated.  Encouraged good hydration and replacement of potassium in the diet (bananas, cantaloupe, orange juice, oral rehydration  solutions).  Follow-up here as needed.  Work excuse provided.     ED Prescriptions     Medication Sig Dispense Auth. Provider   ondansetron  (ZOFRAN -ODT) 4 MG disintegrating tablet Take 1 tablet (4 mg total) by mouth every 8 (eight) hours as needed for nausea or vomiting. 20 tablet Rilley Stash, FNP      PDMP not reviewed this encounter.   Ival Domino, FNP 06/12/24 1201

## 2024-06-12 NOTE — Discharge Instructions (Addendum)
 Viral gastroenteritis with nausea, vomiting and diarrhea: Ondansetron , 4 mg, melts on tongue, every 8 hours as needed for nausea and vomiting.  Provided handouts and education on nausea vomiting and diarrhea.  Start with a clear liquid diet and slowly advance to bland foods such as bananas, rice, applesauce, toast and other starchy foods.  Advance diet as tolerated.  Encouraged good hydration and replacement of potassium in the diet (bananas, cantaloupe, orange juice, oral rehydration solutions).  Follow-up here as needed.  Work excuse provided.

## 2024-06-12 NOTE — ED Triage Notes (Signed)
 Pt states he had a stomach bug or ate something last week that didn't agree with him. He was having abdominal pain, diarrhea- getting better, and vomited on Sunday. He is feeling some better but needs a note to go back to work. The abdominal pain is described as tightness in the R&LUQ. Pt has taken pepto bismol with some relief.

## 2024-07-15 ENCOUNTER — Encounter: Payer: Self-pay | Admitting: Radiology
